# Patient Record
Sex: Male | Born: 1965
Health system: Southern US, Community
[De-identification: ages and names within clinical notes are randomized; demographics above are authoritative.]

## PROBLEM LIST (undated history)

## (undated) DIAGNOSIS — I10 Essential (primary) hypertension: Secondary | ICD-10-CM

## (undated) DIAGNOSIS — M549 Dorsalgia, unspecified: Secondary | ICD-10-CM

## (undated) DIAGNOSIS — F319 Bipolar disorder, unspecified: Secondary | ICD-10-CM

## (undated) DIAGNOSIS — F4310 Post-traumatic stress disorder, unspecified: Secondary | ICD-10-CM

## (undated) HISTORY — PX: HAND SURGERY: SHX662

## (undated) HISTORY — PX: MOUTH SURGERY: SHX715

---

## 2011-08-08 ENCOUNTER — Encounter (HOSPITAL_COMMUNITY): Payer: Self-pay | Admitting: Emergency Medicine

## 2011-08-08 ENCOUNTER — Emergency Department (HOSPITAL_COMMUNITY)
Admission: EM | Admit: 2011-08-08 | Discharge: 2011-08-08 | Disposition: A | Discharge status: Home / Self Care | Payer: Self-pay | Attending: Emergency Medicine | Admitting: Emergency Medicine

## 2011-08-08 DIAGNOSIS — R109 Unspecified abdominal pain: Secondary | ICD-10-CM | POA: Insufficient documentation

## 2011-08-08 DIAGNOSIS — R197 Diarrhea, unspecified: Secondary | ICD-10-CM | POA: Insufficient documentation

## 2011-08-08 HISTORY — DX: Essential (primary) hypertension: I10

## 2011-08-08 LAB — HEPATIC FUNCTION PANEL
Bilirubin, Direct: 0.1 mg/dL (ref 0.0–0.3)
Total Bilirubin: 0.7 mg/dL (ref 0.3–1.2)

## 2011-08-08 LAB — POCT I-STAT, CHEM 8
Creatinine, Ser: 1.1 mg/dL (ref 0.50–1.35)
Glucose, Bld: 106 mg/dL — ABNORMAL HIGH (ref 70–99)
Hemoglobin: 19 g/dL — ABNORMAL HIGH (ref 13.0–17.0)
TCO2: 20 mmol/L (ref 0–100)

## 2011-08-08 LAB — DIFFERENTIAL
Eosinophils Absolute: 0.3 10*3/uL (ref 0.0–0.7)
Eosinophils Relative: 3 % (ref 0–5)
Lymphs Abs: 1.9 10*3/uL (ref 0.7–4.0)
Monocytes Relative: 8 % (ref 3–12)

## 2011-08-08 LAB — CBC
Hemoglobin: 17.9 g/dL — ABNORMAL HIGH (ref 13.0–17.0)
MCH: 30.3 pg (ref 26.0–34.0)
MCV: 86.9 fL (ref 78.0–100.0)
RBC: 5.9 MIL/uL — ABNORMAL HIGH (ref 4.22–5.81)

## 2011-08-08 LAB — LIPASE, BLOOD: Lipase: 32 U/L (ref 11–59)

## 2011-08-08 MED ORDER — DICYCLOMINE HCL 10 MG/ML IM SOLN
20.0000 mg | Freq: Once | INTRAMUSCULAR | Status: AC
  Administered 2011-08-08: 20 mg via INTRAMUSCULAR
  Filled 2011-08-08: qty 2

## 2011-08-08 MED ORDER — PROMETHAZINE HCL 25 MG PO TABS: 25.0000 mg | ORAL_TABLET | Freq: Four times a day (QID) | ORAL | Status: DC | PRN

## 2011-08-08 MED ORDER — ONDANSETRON HCL 4 MG/2ML IJ SOLN
4.0000 mg | Freq: Once | INTRAMUSCULAR | Status: AC
  Administered 2011-08-08: 4 mg via INTRAVENOUS
  Filled 2011-08-08: qty 2

## 2011-08-08 NOTE — ED Notes (Signed)
Generalized abd pain x 5 days with diarrhea. Reports nausea, no vomiting.

## 2011-08-08 NOTE — Discharge Instructions (Signed)
Diarrhea Infections caused by germs (bacterial) or a virus commonly cause diarrhea. Your caregiver has determined that with time, rest and fluids, the diarrhea should improve. In general, eat normally while drinking more water than usual. Although water may prevent dehydration, it does not contain salt and minerals (electrolytes). Broths, weak tea without caffeine and oral rehydration solutions (ORS) replace fluids and electrolytes. Small amounts of fluids should be taken frequently. Large amounts at one time may not be tolerated. Plain water may be harmful in infants and the elderly. Oral rehydrating solutions (ORS) are available at pharmacies and grocery stores. ORS replace water and important electrolytes in proper proportions. Sports drinks are not as effective as ORS and may be harmful due to sugars worsening diarrhea.  ORS is especially recommended for use in children with diarrhea. As a general guideline for children, replace any new fluid losses from diarrhea and/or vomiting with ORS as follows:   If your child weighs 22 pounds or under (10 kg or less), give 60-120 mL ( -  cup or 2 - 4 ounces) of ORS for each episode of diarrheal stool or vomiting episode.   If your child weighs more than 22 pounds (more than 10 kgs), give 120-240 mL ( - 1 cup or 4 - 8 ounces) of ORS for each diarrheal stool or episode of vomiting.   While correcting for dehydration, children should eat normally. However, foods high in sugar should be avoided because this may worsen diarrhea. Large amounts of carbonated soft drinks, juice, gelatin desserts and other highly sugared drinks should be avoided.   After correction of dehydration, other liquids that are appealing to the child may be added. Children should drink small amounts of fluids frequently and fluids should be increased as tolerated. Children should drink enough fluids to keep urine clear or pale yellow.   Adults should eat normally while drinking more fluids  than usual. Drink small amounts of fluids frequently and increase as tolerated. Drink enough fluids to keep urine clear or pale yellow. Broths, weak decaffeinated tea, lemon lime soft drinks (allowed to go flat) and ORS replace fluids and electrolytes.   Avoid:   Carbonated drinks.   Juice.   Extremely hot or cold fluids.   Caffeine drinks.   Fatty, greasy foods.   Alcohol.   Tobacco.   Too much intake of anything at one time.   Gelatin desserts.   Probiotics are active cultures of beneficial bacteria. They may lessen the amount and number of diarrheal stools in adults. Probiotics can be found in yogurt with active cultures and in supplements.   Wash hands well to avoid spreading bacteria and virus.   Anti-diarrheal medications are not recommended for infants and children.   Only take over-the-counter or prescription medicines for pain, discomfort or fever as directed by your caregiver. Do not give aspirin to children because it may cause Reye's Syndrome.   For adults, ask your caregiver if you should continue all prescribed and over-the-counter medicines.   If your caregiver has given you a follow-up appointment, it is very important to keep that appointment. Not keeping the appointment could result in a chronic or permanent injury, and disability. If there is any problem keeping the appointment, you must call back to this facility for assistance.  SEEK IMMEDIATE MEDICAL CARE IF:   You or your child is unable to keep fluids down or other symptoms or problems become worse in spite of treatment.   Vomiting or diarrhea develops and becomes persistent.     There is vomiting of blood or bile (green material).   There is blood in the stool or the stools are black and tarry.   There is no urine output in 6-8 hours or there is only a small amount of very dark urine.   Abdominal pain develops, increases or localizes.   You have a fever.   Your baby is older than 3 months with a  rectal temperature of 102 F (38.9 C) or higher.   Your baby is 3 months old or younger with a rectal temperature of 100.4 F (38 C) or higher.   You or your child develops excessive weakness, dizziness, fainting or extreme thirst.   You or your child develops a rash, stiff neck, severe headache or become irritable or sleepy and difficult to awaken.  MAKE SURE YOU:   Understand these instructions.   Will watch your condition.   Will get help right away if you are not doing well or get worse.  Document Released: 02/10/2002 Document Revised: 02/09/2011 Document Reviewed: 12/28/2008 Exit[employer] Patient Information 2012 [employer]. 

## 2011-08-08 NOTE — ED Provider Notes (Signed)
History     CSN: [id number]  Arrival date & time 08/08/11  0934   None     Chief Complaint  Patient presents with  . Abdominal Pain    (Consider location/radiation/quality/duration/timing/severity/associated sxs/prior treatment) Patient is a 46 y.o. male presenting with diarrhea. The history is provided by the patient. No language interpreter was used.  Diarrhea The primary symptoms include diarrhea. The illness began yesterday. The onset was sudden. The problem has been gradually worsening.  The illness is also significant for chills. Associated medical issues do not include inflammatory bowel disease, gastric bypass, irritable bowel syndrome or diverticulitis. Risk factors: chicken fried in bad grease.  Pt reports he has had multiple episodes of vomitting.    Past Medical History  Diagnosis Date  . Hypertension     Past Surgical History  Procedure Date  . Hand surgery     No family history on file.  History  Substance Use Topics  . Smoking status: Current Everyday Smoker  . Smokeless tobacco: Not on file  . Alcohol Use: No      Review of Systems  Constitutional: Positive for chills.  Respiratory: Positive for cough and shortness of breath.   Gastrointestinal: Positive for diarrhea.  All other systems reviewed and are negative.    Allergies  Review of patient's allergies indicates no known allergies.  Home Medications   Current Outpatient Rx  Name Route Sig Dispense Refill  . LISINOPRIL 20 MG PO TABS Oral Take 20 mg by mouth daily.      BP 148/99  Pulse 94  Temp(Src) 98.5 F (36.9 C) (Oral)  Resp 16  SpO2 96%  Physical Exam  Constitutional: He is oriented to person, place, and time.  HENT:  Head: Normocephalic and atraumatic.  Eyes: Conjunctivae are normal. Pupils are equal, round, and reactive to light.  Neck: Normal range of motion. Neck supple.  Cardiovascular: Normal rate and normal heart sounds.   Pulmonary/Chest: Effort normal and breath  sounds normal.  Abdominal: Soft.  Musculoskeletal: Normal range of motion.  Neurological: He is alert and oriented to person, place, and time. He has normal reflexes.  Skin: Skin is warm.  Psychiatric: He has a normal mood and affect.    ED Course  Procedures (including critical care time)  Labs Reviewed  CBC - Abnormal; Notable for the following:    RBC 5.90 (*)    Hemoglobin 17.9 (*)    All other components within normal limits  POCT I-STAT, CHEM 8 - Abnormal; Notable for the following:    Glucose, Bld 106 (*)    Calcium, Ion 1.34 (*)    Hemoglobin 19.0 (*)    HCT 56.0 (*)    All other components within normal limits  DIFFERENTIAL   No results found.   No diagnosis found.    MDM   Results for orders placed during the hospital encounter of 08/08/11  CBC      Component Value Range   WBC 9.6  4.0 - 10.5 (K/uL)   RBC 5.90 (*) 4.22 - 5.81 (MIL/uL)   Hemoglobin 17.9 (*) 13.0 - 17.0 (g/dL)   HCT 40.9  81.1 - 91.4 (%)   MCV 86.9  78.0 - 100.0 (fL)   MCH 30.3  26.0 - 34.0 (pg)   MCHC 34.9  30.0 - 36.0 (g/dL)   RDW 78.2  95.6 - 21.3 (%)   Platelets 263  150 - 400 (K/uL)  DIFFERENTIAL      Component Value Range  Neutrophils Relative 68  43 - 77 (%)   Neutro Abs 6.5  1.7 - 7.7 (K/uL)   Lymphocytes Relative 20  12 - 46 (%)   Lymphs Abs 1.9  0.7 - 4.0 (K/uL)   Monocytes Relative 8  3 - 12 (%)   Monocytes Absolute 0.8  0.1 - 1.0 (K/uL)   Eosinophils Relative 3  0 - 5 (%)   Eosinophils Absolute 0.3  0.0 - 0.7 (K/uL)   Basophils Relative 0  0 - 1 (%)   Basophils Absolute 0.0  0.0 - 0.1 (K/uL)  POCT I-STAT, CHEM 8      Component Value Range   Sodium 140  135 - 145 (mEq/L)   Potassium 4.3  3.5 - 5.1 (mEq/L)   Chloride 111  96 - 112 (mEq/L)   BUN 21  6 - 23 (mg/dL)   Creatinine, Ser 1.91  0.50 - 1.35 (mg/dL)   Glucose, Bld 478 (*) 70 - 99 (mg/dL)   Calcium, Ion 2.95 (*) 1.12 - 1.32 (mmol/L)   TCO2 20  0 - 100 (mmol/L)   Hemoglobin 19.0 (*) 13.0 - 17.0 (g/dL)   HCT  62.1 (*) 30.8 - 52.0 (%)  HEPATIC FUNCTION PANEL      Component Value Range   Total Protein 8.4 (*) 6.0 - 8.3 (g/dL)   Albumin 4.4  3.5 - 5.2 (g/dL)   AST 36  0 - 37 (U/L)   ALT 64 (*) 0 - 53 (U/L)   Alkaline Phosphatase 85  39 - 117 (U/L)   Total Bilirubin 0.7  0.3 - 1.2 (mg/dL)   Bilirubin, Direct 0.1  0.0 - 0.3 (mg/dL)   Indirect Bilirubin 0.6  0.3 - 0.9 (mg/dL)  LIPASE, BLOOD      Component Value Range   Lipase 32  11 - 59 (U/L)   No results found.    Pt given Iv fluids,  zofran and bentyl..  I advised imodium.  Pt given phenergan for nausea.   Pt advised to return for recheck if not significantly improving in 24 hours    Lonia Skinner [place], [place] 08/08/11 1240

## 2011-08-08 NOTE — ED Notes (Signed)
Pt to be d/c'ed after second liter of fluid finishes.

## 2011-08-08 NOTE — ED Provider Notes (Signed)
Medical screening examination/treatment/procedure(s) were performed by non-physician practitioner and as supervising physician I was immediately available for consultation/collaboration.  Cheri Guppy, MD 08/08/11 1538

## 2011-08-08 NOTE — ED Notes (Signed)
Onset 5 days ago general abdominal pain fullness with nausea and loose stool. Pain currently 6-7/10 achy sharp.

## 2011-11-26 ENCOUNTER — Emergency Department (HOSPITAL_COMMUNITY)
Admission: EM | Admit: 2011-11-26 | Discharge: 2011-11-26 | Disposition: A | Discharge status: Home / Self Care | Payer: Self-pay | Attending: Emergency Medicine | Admitting: Emergency Medicine

## 2011-11-26 ENCOUNTER — Encounter (HOSPITAL_COMMUNITY): Payer: Self-pay | Admitting: *Deleted

## 2011-11-26 DIAGNOSIS — X58XXXA Exposure to other specified factors, initial encounter: Secondary | ICD-10-CM | POA: Insufficient documentation

## 2011-11-26 DIAGNOSIS — K029 Dental caries, unspecified: Secondary | ICD-10-CM | POA: Insufficient documentation

## 2011-11-26 DIAGNOSIS — F17200 Nicotine dependence, unspecified, uncomplicated: Secondary | ICD-10-CM | POA: Insufficient documentation

## 2011-11-26 DIAGNOSIS — K0889 Other specified disorders of teeth and supporting structures: Secondary | ICD-10-CM

## 2011-11-26 DIAGNOSIS — S025XXA Fracture of tooth (traumatic), initial encounter for closed fracture: Secondary | ICD-10-CM | POA: Insufficient documentation

## 2011-11-26 DIAGNOSIS — I10 Essential (primary) hypertension: Secondary | ICD-10-CM | POA: Insufficient documentation

## 2011-11-26 MED ORDER — HYDROCODONE-ACETAMINOPHEN 7.5-500 MG/15ML PO SOLN: 15.0000 mL | Freq: Four times a day (QID) | ORAL | Status: DC | PRN

## 2011-11-26 MED ORDER — PENICILLIN V POTASSIUM 500 MG PO TABS: 500.0000 mg | ORAL_TABLET | Freq: Three times a day (TID) | ORAL | Status: DC

## 2011-11-26 NOTE — ED Provider Notes (Signed)
History     CSN: [id number]  Arrival date & time 11/26/11  6644   First MD Initiated Contact with Patient 11/26/11 1102      Chief Complaint  Patient presents with  . Dental Pain    (Consider location/radiation/quality/duration/timing/severity/associated sxs/prior treatment) HPI  46 year old male presents complaining of dental pain. Patient reports he has a broken tooth to his right lower jaw which he broke several months ago. While eating bojango he accidentally chipped the same tooth again 2 weeks ago. For the past 3 days he has increasing pain to the tooth.  Described as sharp, stabbing and radiates to his R ear.  Pain worsening with eating and with cold water.  Denies fever, sore throat, neck pain, recent trauma, or rash.  Denies hearing changes.  Has tried ibuprofen without relief.  Is a smoker.    Past Medical History  Diagnosis Date  . Hypertension     Past Surgical History  Procedure Date  . Hand surgery     No family history on file.  History  Substance Use Topics  . Smoking status: Current Every Day Smoker  . Smokeless tobacco: Not on file  . Alcohol Use: No      Review of Systems  All other systems reviewed and are negative.    Allergies  Review of patient's allergies indicates no known allergies.  Home Medications   Current Outpatient Rx  Name Route Sig Dispense Refill  . AMLODIPINE BESYLATE 10 MG PO TABS Oral Take 10 mg by mouth daily.    Marland Kitchen LISINOPRIL 20 MG PO TABS Oral Take 40 mg by mouth daily.     Marland Kitchen PROMETHAZINE HCL 25 MG PO TABS Oral Take 1 tablet (25 mg total) by mouth every 6 (six) hours as needed for nausea. 15 tablet 0    BP 133/80  Pulse 97  Temp 98.2 F (36.8 C) (Oral)  Resp 16  SpO2 98%  Physical Exam  Nursing note and vitals reviewed. Constitutional: He is oriented to person, place, and time. He appears well-developed and well-nourished. No distress.  HENT:  Head: Normocephalic and atraumatic.  Right Ear: External ear  normal.  Left Ear: External ear normal.  Nose: Nose normal.  Mouth/Throat: Oropharynx is clear and moist. No oropharyngeal exudate.    Eyes: Conjunctivae normal are normal.  Neck: Neck supple.  Musculoskeletal: Normal range of motion.  Lymphadenopathy:    He has no cervical adenopathy.  Neurological: He is alert and oriented to person, place, and time.  Skin: Skin is warm. No rash noted.  Psychiatric: He has a normal mood and affect.    ED Course  Procedures (including critical care time)  Labs Reviewed - No data to display No results found.   No diagnosis found.  1. Dental pain  MDM  Pt with dental pain from an avulsed right lower premolar.  Has no fever, or obvious abscess.  Will prescribe abx and pain medication.  Dental referral given.    BP 133/80  Pulse 97  Temp 98.2 F (36.8 C) (Oral)  Resp 16  SpO2 98%         Fayrene Helper, PA-C 11/26/11 1131

## 2011-11-26 NOTE — ED Provider Notes (Signed)
Medical screening examination/treatment/procedure(s) were performed by non-physician practitioner and as supervising physician I was immediately available for consultation/collaboration.   Larry Griffith. Quadry Kampa, MD 11/26/11 1141

## 2014-02-13 ENCOUNTER — Encounter (HOSPITAL_COMMUNITY): Payer: Self-pay | Admitting: Emergency Medicine

## 2014-02-13 ENCOUNTER — Emergency Department (HOSPITAL_COMMUNITY)
Admission: EM | Admit: 2014-02-13 | Discharge: 2014-02-14 | Disposition: A | Discharge status: Home / Self Care | Payer: Self-pay | Attending: Emergency Medicine | Admitting: Emergency Medicine

## 2014-02-13 DIAGNOSIS — Z79899 Other long term (current) drug therapy: Secondary | ICD-10-CM | POA: Insufficient documentation

## 2014-02-13 DIAGNOSIS — R197 Diarrhea, unspecified: Secondary | ICD-10-CM | POA: Insufficient documentation

## 2014-02-13 DIAGNOSIS — N289 Disorder of kidney and ureter, unspecified: Secondary | ICD-10-CM | POA: Insufficient documentation

## 2014-02-13 DIAGNOSIS — R1110 Vomiting, unspecified: Secondary | ICD-10-CM | POA: Insufficient documentation

## 2014-02-13 DIAGNOSIS — Z792 Long term (current) use of antibiotics: Secondary | ICD-10-CM | POA: Insufficient documentation

## 2014-02-13 DIAGNOSIS — Z720 Tobacco use: Secondary | ICD-10-CM | POA: Insufficient documentation

## 2014-02-13 DIAGNOSIS — I10 Essential (primary) hypertension: Secondary | ICD-10-CM | POA: Insufficient documentation

## 2014-02-13 DIAGNOSIS — R55 Syncope and collapse: Secondary | ICD-10-CM | POA: Insufficient documentation

## 2014-02-13 LAB — BASIC METABOLIC PANEL
ANION GAP: 12 (ref 5–15)
BUN: 21 mg/dL (ref 6–23)
CHLORIDE: 100 meq/L (ref 96–112)
CO2: 26 meq/L (ref 19–32)
CREATININE: 1.56 mg/dL — AB (ref 0.50–1.35)
Calcium: 8.5 mg/dL (ref 8.4–10.5)
GFR calc Af Amer: 59 mL/min — ABNORMAL LOW (ref >90)
GFR calc non Af Amer: 51 mL/min — ABNORMAL LOW (ref >90)
GLUCOSE: 108 mg/dL — AB (ref 70–99)
Potassium: 3.7 mEq/L (ref 3.7–5.3)
Sodium: 138 mEq/L (ref 137–147)

## 2014-02-13 LAB — CBC
HCT: 43.1 % (ref 39.0–52.0)
HEMOGLOBIN: 14.9 g/dL (ref 13.0–17.0)
MCH: 29.6 pg (ref 26.0–34.0)
MCHC: 34.6 g/dL (ref 30.0–36.0)
MCV: 85.5 fL (ref 78.0–100.0)
PLATELETS: 282 10*3/uL (ref 150–400)
RBC: 5.04 MIL/uL (ref 4.22–5.81)
RDW: 12.8 % (ref 11.5–15.5)
WBC: 9.4 10*3/uL (ref 4.0–10.5)

## 2014-02-13 LAB — I-STAT CHEM 8, ED
BUN: 24 mg/dL — AB (ref 6–23)
CALCIUM ION: 1.07 mmol/L — AB (ref 1.12–1.23)
CHLORIDE: 100 meq/L (ref 96–112)
CREATININE: 1.7 mg/dL — AB (ref 0.50–1.35)
Glucose, Bld: 110 mg/dL — ABNORMAL HIGH (ref 70–99)
HCT: 47 % (ref 39.0–52.0)
Hemoglobin: 16 g/dL (ref 13.0–17.0)
Potassium: 3.5 mEq/L — ABNORMAL LOW (ref 3.7–5.3)
SODIUM: 139 meq/L (ref 137–147)
TCO2: 25 mmol/L (ref 0–100)

## 2014-02-13 LAB — CBG MONITORING, ED: Glucose-Capillary: 121 mg/dL — ABNORMAL HIGH (ref 70–99)

## 2014-02-13 LAB — TROPONIN I

## 2014-02-13 MED ORDER — SODIUM CHLORIDE 0.9 % IV BOLUS (SEPSIS)
1000.0000 mL | Freq: Once | INTRAVENOUS | Status: AC
  Administered 2014-02-14: 1000 mL via INTRAVENOUS

## 2014-02-13 MED ORDER — SODIUM CHLORIDE 0.9 % IV BOLUS (SEPSIS)
1000.0000 mL | Freq: Once | INTRAVENOUS | Status: AC
  Administered 2014-02-13: 1000 mL via INTRAVENOUS

## 2014-02-13 NOTE — ED Notes (Signed)
Pt was placed in a gown with continuous cardiac monitoring, BP cuff and pulse ox on

## 2014-02-13 NOTE — ED Notes (Signed)
Pt sts he donated plasma, drank two bottles of water and ate a peanut butter and jelly sandwich.  Shortly after eating patient became diaphoretic and vomited.  Pt also experienced uncontrollable diarrhea.  Two bystanders told pt "you passed out".  [facility] went [facility] to [facility], and afterwards had another episode of diaphoresis, emesis and syncope.  Pt denies any injury, falls, chest pain.

## 2014-02-13 NOTE — ED Provider Notes (Signed)
CSN: [id number]     Arrival date & time 02/13/14  2015 History   First MD Initiated Contact with Patient 02/13/14 2040     Chief Complaint  Patient presents with  . Loss of Consciousness     (Consider location/radiation/quality/duration/timing/severity/associated sxs/prior Treatment) HPI Comments: Patient presents with syncope. [facility] is [facility] at [facility]. He states that he donated plasma today and he hasn't really eaten anything most of the day. He hasn't been drinking well. He was sitting out on the curve trying to eat a peanut butter sandwich and had a syncopal episode. He states that he had an episode of vomiting and diarrhea with the syncope. He then went up and took a shower and felt better. When he came downstairs he got lightheaded again and diaphoretic and had a second syncopal episode. Currently feels much better. He was noted to be hypotensive on EMS arrival. He denies any chest pain. He feels a little bit short of breath. He denies any recent illnesses. He denies any headache or any injuries from the syncope. He denies any fevers cough congestion or other recent illnesses. He denies any past history of syncope or any past heart problems. He does have a history of hypertension and did take his antihypertensive medications today. He denies any alcohol or drug use other than occasional marijuana use. He denies any IV drug use.  Patient is a 48 y.o. male presenting with syncope.  Loss of Consciousness Associated symptoms: vomiting (1)   Associated symptoms: no chest pain, no diaphoresis, no dizziness, no fever, no headaches, no nausea, no shortness of breath and no weakness     Past Medical History  Diagnosis Date  . Hypertension    Past Surgical History  Procedure Laterality Date  . Hand surgery     No family history on file. History  Substance Use Topics  . Smoking status: Current Every Day Smoker -- 0.50 packs/day  . Smokeless tobacco: Not on file  .  Alcohol Use: No    Review of Systems  Constitutional: Negative for fever, chills, diaphoresis and fatigue.  HENT: Negative for congestion, rhinorrhea and sneezing.   Eyes: Negative.   Respiratory: Negative for cough, chest tightness and shortness of breath.   Cardiovascular: Positive for syncope. Negative for chest pain and leg swelling.  Gastrointestinal: Positive for vomiting (1) and diarrhea (1). Negative for nausea, abdominal pain and blood in stool.  Genitourinary: Negative for frequency, hematuria, flank pain and difficulty urinating.  Musculoskeletal: Negative for back pain and arthralgias.  Skin: Negative for rash.  Neurological: Positive for syncope. Negative for dizziness, speech difficulty, weakness, numbness and headaches.      Allergies  Review of patient's allergies indicates no known allergies.  Home Medications   Prior to Admission medications   Medication Sig Start Date End Date Taking? Authorizing Provider  amLODipine (NORVASC) 10 MG tablet Take 10 mg by mouth daily.   Yes Historical Provider, MD  carvedilol (COREG) 12.5 MG tablet Take 12.5 mg by mouth 2 (two) times daily with a meal.   Yes Historical Provider, MD  ibuprofen (ADVIL,MOTRIN) 800 MG tablet Take 800 mg by mouth every 8 (eight) hours as needed for moderate pain.   Yes Historical Provider, MD  lisinopril-hydrochlorothiazide (PRINZIDE,ZESTORETIC) 20-25 MG per tablet Take 1 tablet by mouth daily.   Yes Historical Provider, MD  penicillin v potassium (VEETID) 500 MG tablet Take 1 tablet (500 mg total) by mouth 3 (three) times daily. 11/26/11   Fayrene HelperBowie Tran, PA-C  promethazine (PHENERGAN) 25 MG tablet Take 1 tablet (25 mg total) by mouth every 6 (six) hours as needed for nausea. 08/08/11 08/15/11  Lonia SkinnerLeslie K Sofia, PA-C   BP 135/78 mmHg  Pulse 71  Temp(Src) 97.6 F (36.4 C) (Oral)  Resp 19  SpO2 93% Physical Exam  Constitutional: He is oriented to person, place, and time. He appears well-developed and  well-nourished.  HENT:  Head: Normocephalic and atraumatic.  Eyes: Pupils are equal, round, and reactive to light.  Neck: Normal range of motion. Neck supple.  Cardiovascular: Normal rate, regular rhythm and normal heart sounds.   Pulmonary/Chest: Effort normal and breath sounds normal. No respiratory distress. He has no wheezes. He has no rales. He exhibits no tenderness.  Abdominal: Soft. Bowel sounds are normal. There is no tenderness. There is no rebound and no guarding.  Musculoskeletal: Normal range of motion. He exhibits no edema.  Lymphadenopathy:    He has no cervical adenopathy.  Neurological: He is alert and oriented to person, place, and time. He has normal strength. No cranial nerve deficit or sensory deficit. GCS eye subscore is 4. GCS verbal subscore is 5. GCS motor subscore is 6.  Finger-nose intact, no pronator drift  Skin: Skin is warm and dry. No rash noted.  Psychiatric: He has a normal mood and affect.    ED Course  Procedures (including critical care time) Labs Review Labs Reviewed  BASIC METABOLIC PANEL - Abnormal; Notable for the following:    Glucose, Bld 108 (*)    Creatinine, Ser 1.56 (*)    GFR calc non Af Amer 51 (*)    GFR calc Af Amer 59 (*)    All other components within normal limits  CBG MONITORING, ED - Abnormal; Notable for the following:    Glucose-Capillary 121 (*)    All other components within normal limits  I-STAT CHEM 8, ED - Abnormal; Notable for the following:    Potassium 3.5 (*)    BUN 24 (*)    Creatinine, Ser 1.70 (*)    Glucose, Bld 110 (*)    Calcium, Ion 1.07 (*)    All other components within normal limits  CBC  TROPONIN I    Imaging Review No results found.   EKG Interpretation   Date/Time:  Friday February 13 2014 20:29:49 EST Ventricular Rate:  62 PR Interval:  165 QRS Duration: 85 QT Interval:  424 QTC Calculation: 431 R Axis:   61 Text Interpretation:  Sinus rhythm ST elevation suggests acute  pericarditis  No old tracing to compare probable early repolarization  Confirmed by Tresea Heine  MD, Keevin Panebianco ([id number]) on 02/13/2014 9:12:19 PM      MDM   Final diagnoses:  None    Pt has had 2L of fluid, still feels dizzy on ambulating.  Dr. Ranae PalmsYelverton to follow.  Will give one more liter of fluid.  Pt will need f/u creatinine by his PCP.  I did discuss this with pt.  [facility].  He does not have symptoms that sound consistent with ACS.    Rolan BuccoMelanie Joclyn Alsobrook, MD 02/14/14 [phone]

## 2014-02-13 NOTE — ED Notes (Signed)
[facility] after a syncopal [facility] today, states he donated plasma and hasn't had much to eat or drink today. Hyperacute T waves on EKGs. Pt a/ox4 at this time, unknown if pt hit his head.

## 2014-02-13 NOTE — ED Notes (Signed)
Patient ambulated to and from restroom without assistance. States that he feels dizzy when standing and ambulating.

## 2014-02-14 NOTE — ED Provider Notes (Signed)
Patient ambulating without difficulty. Awake and alert. Oxygen saturation is 93% on room air.  Loren Raceravid Lindee Leason, MD 02/14/14 [phone]

## 2014-02-14 NOTE — Discharge Instructions (Signed)
°Emergency Department Resource Guide °1) Find a Doctor and Pay Out of Pocket °Although you won't have to find out who is covered by your insurance plan, it is a good idea to ask around and get recommendations. You will then need to call the office and see if the doctor you have chosen will accept you as a new patient and what types of options they offer for patients who are self-pay. Some doctors offer discounts or will set up payment plans for their patients who do not have insurance, but you will need to ask so you aren't surprised when you get to your appointment. ° °2) Contact Your Local Health Department °Not all health departments have doctors that can see patients for sick visits, but many do, so it is worth a call to see if yours does. If you don't know where your local health department is, you can check in your phone book. The CDC also has a tool to help you locate your state's health department, and many state websites also have listings of all of their local health departments. ° °3) Find a Walk-in Clinic °If your illness is not likely to be very severe or complicated, you may want to try a walk in clinic. These are popping up all over the country in pharmacies, drugstores, and shopping centers. They're usually staffed by nurse practitioners or physician assistants that have been trained to treat common illnesses and complaints. They're usually fairly quick and inexpensive. However, if you have serious medical issues or chronic medical problems, these are probably not your best option. ° °No Primary Care Doctor: °- Call [facility] at  832-8000 - they can help you locate a primary care doctor that  accepts your insurance, provides certain services, etc. °- Physician Referral Service- 1-800-533-3463 ° °Chronic Pain Problems: °Organization         Address  Phone   Notes  °[facility]  (336) 297-2271 Patients need to be referred by their primary care doctor.  ° °Medication  Assistance: °Organization         Address  Phone   Notes  °[place] [facility] [address] °[place], [place] [address] (336) 641-8030 --Must be a resident of [place] °-- Must have NO insurance coverage whatsoever (no Medicaid/ Medicare, etc.) °-- The pt. MUST have a primary care doctor that directs their care regularly and follows them in the community °  °[facility]  (866) 331-1348   °[employer]  (888) 892-1162   ° °Agencies that provide inexpensive medical care: °Organization         Address  Phone   Notes  °[facility]  (336) 832-8035   °[facility]    (336) 832-7272   °[facility] [address] °[place], [place] [address] (336) 832-4777   °[facility] [address], °[place] (336) 271-4999   °[facility]    (336) 373-0678   °[facility]    (336) 272-1050   °[facility] ° [address], [place] Phone:  (336) 832-4444, Fax:  (336) 832-4440 Hours of Operation:  9 am - 6 pm, M-F.  Also accepts Medicaid/Medicare and self-pay.  °[facility] ° [address], [place] Phone: (336) 832-3150, Fax: (336) 832-3151. Hours of Operation:  8:30 am - 5:30 pm, M-F.  Also accepts Medicaid and self-pay.  °[facility] [place] [address], [place] Phone: (336) 878-6027   °[facility] [address], [place], [place] (336)723-1848, Ext. 123 Mondays & Thursdays: 7-9 AM.  First 15 patients are seen on a first come, first serve basis. °  ° °Medicaid-accepting [place] Providers: ° °Organization         Address  Phone   Notes  °[facility] [address], [place] (336) 641-2100 Also accepts self-pay patients.  °[facility] [address], [place] ° (336) 856-9996   °[facility] [address], [place]  (336) 288-8857   °[facility] [address] [place] [address], [place] (336) 299-7000   °Veita Bland [address], [place]  ° (336) 373-1557 Only accepts [place] Access Medicaid patients after they have their name applied to their card.  ° °Self-Pay (no insurance) in [place]: ° °Organization         Address  Phone   Notes  °Sickle Cell Patients, [facility] [address], [place] (336) 832-1970   °[facility] [address], [place] (336) 832-4400   °[facility] [place] ° [address], [place] (336) 992-4800   °[facility]/Dr. Osei-Bonsu ° [address] [place] [address], [place] or [address], [place] (336) 841-8500 Phone number for both [place] and [place] locations is the same.  °[facility] [address], [place] (336) 299-0000   °[facility] [address] [place] [address], [place] or [address] (336) 852-7530 °(336) 878-2260   °[facility] [address], [place] (336) 350-1642, phone; (336) 294-5005, fax Sees patients 1st and 3rd Saturday of every month.  Must not qualify for public or private insurance (i.e. Medicaid, Medicare, [place] Health Choice, Veterans' Benefits) • Household income should be no more than 200% of the poverty level •The clinic cannot treat you if you are pregnant or think you are pregnant • Sexually transmitted diseases are not treated at the clinic.  ° ° °Dental Care: °Organization         Address  Phone  Notes  °[place] [facility] [address], [place] (336) 641-6152 Accepts children up to age 21 who are enrolled in Medicaid or [place] Health Choice; pregnant women with a Medicaid card; and children who have applied for Medicaid or [place] Health Choice, but were declined, whose parents can pay a reduced fee at time of service.  °[place]  [facility] [place]  [address], [place] (336) 641-7733 Accepts children up to age 21 who are enrolled in Medicaid or [place] Health Choice; pregnant women with a Medicaid card; and children who have applied for Medicaid or [place] Health Choice, but were declined, whose parents can pay a reduced fee at time of service.  °[facility] ° [address], [place] (336) 641-4533 Patients are seen by appointment only. Walk-ins are not accepted. [facility] will see patients 18 years of age and older. °Monday - Tuesday (8am-5pm) °Most Wednesdays (8:30-5pm) °$30 per visit, cash only  °[facility] ° [address], [place] (336) 641-4533 Patients are seen by appointment only. Walk-ins are not accepted. [facility] will see patients 18 years of age and older. °One   Wednesday Evening (Monthly: Volunteer Based).  $30 per visit, cash only  °[facility]  (919) 537-3737 for adults; Children under age 4, call Graduate Pediatric Dentistry at ([phone]) [phone]. Children aged 4-14, please call (919) [phone] to request a pediatric application. ° Dental services are provided in all areas of dental care including fillings, crowns and bridges, complete and partial dentures, implants, gum treatment, root canals, and extractions. Preventive care is also provided. Treatment is provided to both adults and children. °Patients are selected via a lottery and there is often a waiting list. °  °[facility] [address], °[place] ° (336) 763-8833 [email or web address] °  °[facility] [address], [place], [place] (336)723-1848, Ext. 123 Second and Fourth Thursday of each month, opens at 6:30 AM; Clinic ends at 9 AM.  Patients are seen on a first-come first-served basis, and a limited number are seen during each clinic.  ° °[facility] ° [address], [place], [place] (336) 723-7904    Eligibility Requirements °You must have lived in [place], [place], or [place] counties for at least the last three months. °  You cannot be eligible for state or federal sponsored healthcare insurance, including Veterans Administration, Medicaid, or Medicare. °  You generally cannot be eligible for healthcare insurance through your employer.  °  How to apply: °Eligibility screenings are held every Tuesday and Wednesday afternoon from 1:00 pm until 4:00 pm. You do not need an appointment for the interview!  °[facility] [address], [place], [place] 336-631-2330   °[facility]  336-342-8273   °[facility]  336-703-3100   °[facility]  336-570-6415   ° °Behavioral Health Resources in the Community: °Intensive Outpatient Programs °Organization         Address  Phone  Notes  °[facility] [address], [place], [place] 336-878-6098   °[facility] [address], [place], [place] 336-832-9800   °[facility] [address], [place], [place] ° 336-882-2125   °[facility] [address],  °[place], [place] 1-800-853-5163 or 336-641-4981   °Substance Abuse Resources °Organization         Address  Phone  Notes  °[facility]  336-882-2125   °[facility]  336-784-9470   °[facility]  336-285-9073   °[facility]  336-845-3988   °Residential & Outpatient Substance Abuse Program  1-800-659-3381   °Psychological Services °Organization         Address  Phone  Notes  °[facility]  336- 832-9600   °[facility]  336- 378-7881   °[facility] [address], [place] 1-800-853-5163 or 336-641-4981   ° °Mobile Crisis Teams °Organization         Address  Phone  Notes  °[facility], Mobile Crisis Care Unit  1-877-626-1772   °Assertive °[facility] ° [address]  [place], [place] 336-834-9664   °Sharon DeEsch [address] °[place] 336-554-5454   ° °Self-Help/Support Groups °Organization         Address  Phone             Notes  °[facility] - variety of support groups  336- 373-1402 Call for more information  °Narcotics Anonymous (NA), [facility] [address], °[place]  2 meetings at this location  ° °  Residential Treatment Programs °Organization         Address  Phone  Notes  °[facility] [address],    °[place]  1-866-801-8205   °[facility] ° [address], [place], [place] 704-293-8524   °[facility] [address], [place] 336-845-3988 Admissions: 8am-3pm M-F  °[facility] [address]    °[place], [place] 336-841-1104   °[facility] [address], [place], [place] 336-379-7146   °[facility] [address]  °[place], [place] 336-285-9073   °[facility] Intensive Outpatient [address], [place], [place] 336-852-3033   °[facility] [address]  °[place], [place] 1-877-615-2722 or 336-784-9470   °[facility] [address] [place], [place] 336-227-7417 Accepts Medicaid  °[facility] [address]  °[place] 1-800-659-3381 Substance Abuse/Addiction Treatment  ° °[place] Behavioral Health Resources °Organization         Address  Phone  Notes  °[facility]  (888) 581-9988   °Julie Brannon, PhD [address] [place], [place]   (336) 349-5553 or (336) 951-0000   °[facility]   [address] °[place], [place] (336) 349-4454   °[facility] [address], [place], [place] (336) 342-8316 Insurance/Medicaid/sponsorship through [facility]  °[facility] [address]                                    [place], [place] (336) 342-8316 Therapy/tele-psych/case    °[facility] [address]  ° [place], [place] (336) 349-2233    °Dr. Arfeen  (336) 349-4544   °[facility] [place]  [employer] [place] [facility] 1) [address], [place] °2) [address], [place] °3)  [address], [place] (336) 349-3220 °(336) 342-7768 ° °(336) 342-8140   °[place] [facility] (336) 342-1394 or (336) 342-3537 (After Hours)    ° ° ° °Syncope °Syncope is a medical term for fainting or passing out. This means you lose consciousness and drop to the ground. People are generally unconscious for less than 5 minutes. You may have some muscle twitches for up to 15 seconds before waking up and returning to normal. Syncope occurs more often in older adults, but it can happen to anyone. While most causes of syncope are not dangerous, syncope can be a sign of a serious medical problem. It is important to seek medical care.  °CAUSES  °Syncope is caused by a sudden drop in blood flow to the brain. The specific cause is often not determined. Factors that can bring on syncope include: °· Taking medicines that lower blood pressure. °· Sudden changes in posture, such as standing up quickly. °· Taking more medicine than prescribed. °· Standing in one place for too long. °· Seizure disorders. °· Dehydration and excessive exposure to heat. °· Low blood sugar (hypoglycemia). °· Straining to have a bowel movement. °· Heart disease, irregular heartbeat, or other circulatory problems. °· Fear, emotional distress, seeing blood, or severe pain. °SYMPTOMS  °Right before fainting, you may: °· Feel dizzy or light-headed. °· Feel nauseous. °· See all white or all black in your field of vision. °· Have cold, clammy skin. °DIAGNOSIS  °Your health care provider will ask about your symptoms, perform   a physical exam, and perform an electrocardiogram (ECG) to record the electrical activity of your heart. Your health care provider may also perform other heart or blood tests to  determine the cause of your syncope which may include: °· Transthoracic echocardiogram (TTE). During echocardiography, sound waves are used to evaluate how blood flows through your heart. °· Transesophageal echocardiogram (TEE). °· Cardiac monitoring. This allows your health care provider to monitor your heart rate and rhythm in real time. °· Holter monitor. This is a portable device that records your heartbeat and can help diagnose heart arrhythmias. It allows your health care provider to track your heart activity for several days, if needed. °· Stress tests by exercise or by giving medicine that makes the heart beat faster. °TREATMENT  °In most cases, no treatment is needed. Depending on the cause of your syncope, your health care provider may recommend changing or stopping some of your medicines. °HOME CARE INSTRUCTIONS °· Have someone stay with you until you feel stable. °· Do not drive, use machinery, or play sports until your health care provider says it is okay. °· Keep all follow-up appointments as directed by your health care provider. °· Lie down right away if you start feeling like you might faint. Breathe deeply and steadily. Wait until all the symptoms have passed. °· Drink enough fluids to keep your urine clear or pale yellow. °· If you are taking blood pressure or heart medicine, get up slowly and take several minutes to sit and then stand. This can reduce dizziness. °SEEK IMMEDIATE MEDICAL CARE IF:  °· You have a severe headache. °· You have unusual pain in the chest, abdomen, or back. °· You are bleeding from your mouth or rectum, or you have black or tarry stool. °· You have an irregular or very fast heartbeat. °· You have pain with breathing. °· You have repeated fainting or seizure-like jerking during an episode. °· You faint when sitting or lying down. °· You have confusion. °· You have trouble walking. °· You have severe weakness. °· You have vision problems. °If you fainted, call your local  emergency services (911 in [place] Do not drive yourself to the hospital.  °MAKE SURE YOU: °· Understand these instructions. °· Will watch your condition. °· Will get help right away if you are not doing well or get worse. °Document Released: 02/20/2005 Document Revised: 02/25/2013 Document Reviewed: 04/21/2011 °ExitCare® Patient Information ©2015 [employer]. This information is not intended to replace advice given to you by your health care provider. Make sure you discuss any questions you have with your health care provider. ° °

## 2014-03-05 ENCOUNTER — Emergency Department (HOSPITAL_COMMUNITY)
Admission: EM | Admit: 2014-03-05 | Discharge: 2014-03-05 | Disposition: A | Discharge status: Home / Self Care | Payer: Self-pay | Attending: Emergency Medicine | Admitting: Emergency Medicine

## 2014-03-05 ENCOUNTER — Encounter (HOSPITAL_COMMUNITY): Payer: Self-pay | Admitting: *Deleted

## 2014-03-05 ENCOUNTER — Emergency Department (HOSPITAL_COMMUNITY): Payer: Self-pay

## 2014-03-05 DIAGNOSIS — R059 Cough, unspecified: Secondary | ICD-10-CM

## 2014-03-05 DIAGNOSIS — Z792 Long term (current) use of antibiotics: Secondary | ICD-10-CM | POA: Insufficient documentation

## 2014-03-05 DIAGNOSIS — Z720 Tobacco use: Secondary | ICD-10-CM | POA: Insufficient documentation

## 2014-03-05 DIAGNOSIS — I10 Essential (primary) hypertension: Secondary | ICD-10-CM | POA: Insufficient documentation

## 2014-03-05 DIAGNOSIS — J069 Acute upper respiratory infection, unspecified: Secondary | ICD-10-CM

## 2014-03-05 DIAGNOSIS — R091 Pleurisy: Secondary | ICD-10-CM

## 2014-03-05 DIAGNOSIS — R05 Cough: Secondary | ICD-10-CM

## 2014-03-05 DIAGNOSIS — Z79899 Other long term (current) drug therapy: Secondary | ICD-10-CM | POA: Insufficient documentation

## 2014-03-05 LAB — RAPID STREP SCREEN (MED CTR MEBANE ONLY): Streptococcus, Group A Screen (Direct): NEGATIVE

## 2014-03-05 MED ORDER — BENZONATATE 100 MG PO CAPS: 100.0000 mg | ORAL_CAPSULE | Freq: Three times a day (TID) | ORAL | Status: DC

## 2014-03-05 MED ORDER — OXYMETAZOLINE HCL 0.05 % NA SOLN: 1.0000 | Freq: Two times a day (BID) | NASAL | Status: DC

## 2014-03-05 MED ORDER — ALBUTEROL SULFATE HFA 108 (90 BASE) MCG/ACT IN AERS
2.0000 | INHALATION_SPRAY | RESPIRATORY_TRACT | Status: DC | PRN
  Administered 2014-03-05: 2 via RESPIRATORY_TRACT
  Filled 2014-03-05: qty 6.7

## 2014-03-05 MED ORDER — NAPROXEN 500 MG PO TABS: 500.0000 mg | ORAL_TABLET | Freq: Two times a day (BID) | ORAL | Status: DC

## 2014-03-05 MED ORDER — KETOROLAC TROMETHAMINE 60 MG/2ML IM SOLN
60.0000 mg | Freq: Once | INTRAMUSCULAR | Status: AC
  Administered 2014-03-05: 60 mg via INTRAMUSCULAR
  Filled 2014-03-05: qty 2

## 2014-03-05 NOTE — ED Notes (Signed)
x1 week of a head cold; now having a productive cough (green), pleurisy from coughing, and sore throat; taking therma flu, contact mult-system, and Nyquil 3 days ago,

## 2014-03-05 NOTE — ED Notes (Signed)
Patient transported to X-ray 

## 2014-03-05 NOTE — Discharge Instructions (Signed)
Please read and follow all provided instructions.  Your diagnoses today include:  1. Upper respiratory tract infection   2. Pleurisy   3. Cough     You appear to have an upper respiratory infection (URI). An upper respiratory tract infection, or cold, is a viral infection of the air passages leading to the lungs. It should improve gradually after 7-10 days. You may have a lingering cough that lasts for 2- 4 weeks after the infection.  Tests performed today include:  Vital signs. See below for your results today.   Strep test - negative  Chest x-ray - no pneumonia  Medications prescribed:   Tessalon Perles - cough suppressant medication   Albuterol inhaler - medication that opens up your airway  Use inhaler as follows: 1-2 puffs with spacer every 4 hours as needed for wheezing, cough, or shortness of breath.    Naproxen - anti-inflammatory pain medication  Do not exceed 500mg  naproxen every 12 hours, take with food  You have been prescribed an anti-inflammatory medication or NSAID. Take with food. Take smallest effective dose for the shortest duration needed for your pain. Stop taking if you experience stomach pain or vomiting.    Oxymetazoline - nasal spray for congestion. Do not use for more than 3 days because this medicine can cause rebound congestion.   Take any prescribed medications only as directed. Treatment for your infection is aimed at treating the symptoms. There are no medications, such as antibiotics, that will cure your infection.   Home care instructions:  Follow any educational materials contained in this packet.   Your illness is contagious and can be spread to others, especially during the first 3 or 4 days. It cannot be cured by antibiotics or other medicines. Take basic precautions such as washing your hands often, covering your mouth when you cough or sneeze, and avoiding public places where you could spread your illness to others.   Please continue  drinking plenty of fluids.  Use over-the-counter medicines as needed as directed on packaging for symptom relief.  You may also use ibuprofen or tylenol as directed on packaging for pain or fever.  Do not take multiple medicines containing Tylenol or acetaminophen to avoid taking too much of this medication.  Follow-up instructions: Please follow-up with your primary care provider in the next 3 days for further evaluation of your symptoms if you are not feeling better.   Return instructions:   Please return to the Emergency Department if you experience worsening symptoms.   RETURN IMMEDIATELY IF you develop shortness of breath, confusion or altered mental status, a new rash, become dizzy, faint, or poorly responsive, or are unable to be cared for at home.  Please return if you have persistent vomiting and cannot keep down fluids or develop a fever that is not controlled by tylenol or motrin.    Please return if you have any other emergent concerns.  Additional Information:  Your vital signs today were: BP 120/86 mmHg   Pulse 81   Temp(Src) 98.1 F (36.7 C) (Oral)   Resp 14   Ht 5\' 11"  (1.803 m)   Wt 220 lb (99.791 kg)   BMI 30.70 kg/m2   SpO2 97% If your blood pressure (BP) was elevated above 135/85 this visit, please have this repeated by your doctor within one month. --------------

## 2014-03-05 NOTE — ED Provider Notes (Signed)
CSN: [id number]     Arrival date & time 03/05/14  [id number] History   First MD Initiated Contact with Patient 03/05/14 0935     Chief Complaint  Patient presents with  . Influenza  . Sore Throat     (Consider location/radiation/quality/duration/timing/severity/associated sxs/prior Treatment) HPI Comments: Patient with smoking history presents with c/o of headache, nasal congestion, sinus pressure, productive cough, sore throat 1 week. He complains of chest soreness with movement of arms and coughing. He has taken over-the-counter cold and flu medications without relief. No fevers, nausea, vomiting, diarrhea. No sick contacts. No skin rashes. The onset of this condition was acute. The course is constant. Alleviating factors: none.     Patient is a 48 y.o. male presenting with flu symptoms and pharyngitis. The history is provided by the patient and medical records.  Influenza Presenting symptoms: cough, fatigue, headache, rhinorrhea and sore throat   Presenting symptoms: no diarrhea, no fever, no myalgias, no nausea, no shortness of breath and no vomiting   Associated symptoms: nasal congestion   Associated symptoms: no chills, no ear pain and no neck stiffness   Sore Throat Associated symptoms include congestion, coughing, fatigue, headaches and a sore throat. Pertinent negatives include no abdominal pain, chills, fever, myalgias, nausea, rash or vomiting.    Past Medical History  Diagnosis Date  . Hypertension    Past Surgical History  Procedure Laterality Date  . Hand surgery     History reviewed. No pertinent family history. History  Substance Use Topics  . Smoking status: Current Every Day Smoker -- 0.50 packs/day  . Smokeless tobacco: Not on file  . Alcohol Use: No    Review of Systems  Constitutional: Positive for fatigue. Negative for fever and chills.  HENT: Positive for congestion, rhinorrhea, sinus pressure and sore throat. Negative for ear discharge and ear pain.    Eyes: Negative for redness.  Respiratory: Positive for cough, chest tightness and wheezing. Negative for shortness of breath.   Gastrointestinal: Negative for nausea, vomiting, abdominal pain and diarrhea.  Genitourinary: Negative for dysuria.  Musculoskeletal: Negative for myalgias and neck stiffness.  Skin: Negative for rash.  Neurological: Positive for headaches.  Hematological: Negative for adenopathy.      Allergies  Review of patient's allergies indicates no known allergies.  Home Medications   Prior to Admission medications   Medication Sig Start Date End Date Taking? Authorizing Provider  amLODipine (NORVASC) 10 MG tablet Take 10 mg by mouth daily.    Historical Provider, MD  carvedilol (COREG) 12.5 MG tablet Take 12.5 mg by mouth 2 (two) times daily with a meal.    Historical Provider, MD  ibuprofen (ADVIL,MOTRIN) 800 MG tablet Take 800 mg by mouth every 8 (eight) hours as needed for moderate pain.    Historical Provider, MD  lisinopril-hydrochlorothiazide (PRINZIDE,ZESTORETIC) 20-25 MG per tablet Take 1 tablet by mouth daily.    Historical Provider, MD  penicillin v potassium (VEETID) 500 MG tablet Take 1 tablet (500 mg total) by mouth 3 (three) times daily. 11/26/11   Fayrene HelperBowie Tran, PA-C  promethazine (PHENERGAN) 25 MG tablet Take 1 tablet (25 mg total) by mouth every 6 (six) hours as needed for nausea. 08/08/11 08/15/11  Elson AreasLeslie K Sofia, PA-C   BP 139/87 mmHg  Pulse 86  Temp(Src) 98.1 F (36.7 C) (Oral)  Resp 14  Ht 5\' 11"  (1.803 m)  Wt 220 lb (99.791 kg)  BMI 30.70 kg/m2  SpO2 96%   Physical Exam  Constitutional: He appears well-developed  and well-nourished.  HENT:  Head: Normocephalic and atraumatic.  Right Ear: Tympanic membrane, external ear and ear canal normal.  Left Ear: Tympanic membrane, external ear and ear canal normal.  Nose: Mucosal edema and rhinorrhea present.  Mouth/Throat: Uvula is midline and mucous membranes are normal. Mucous membranes are not dry.  No trismus in the jaw. No uvula swelling. Posterior oropharyngeal erythema (mild) present. No oropharyngeal exudate, posterior oropharyngeal edema or tonsillar abscesses.  Eyes: Conjunctivae are normal. Right eye exhibits no discharge. Left eye exhibits no discharge.  Neck: Normal range of motion. Neck supple.  Cardiovascular: Normal rate, regular rhythm and normal heart sounds.   No murmur heard. Pulmonary/Chest: Effort normal. No respiratory distress. He has wheezes (scattered, very slight). He has no rales. He exhibits tenderness (anterior chest wall, bilateral, with palpation).  Abdominal: Soft. There is no tenderness.  Musculoskeletal: He exhibits no edema or tenderness.  Lymphadenopathy:    He has cervical adenopathy (Anterior, minimal).  Neurological: He is alert.  Skin: Skin is warm and dry.  Psychiatric: He has a normal mood and affect.  Nursing note and vitals reviewed.   ED Course  Procedures (including critical care time) Labs Review Labs Reviewed  RAPID STREP SCREEN  CULTURE, GROUP A STREP    Imaging Review Dg Chest 2 View  03/05/2014   CLINICAL DATA:  Two week history of chest pain with deep inspiration  EXAM: CHEST  2 VIEW  COMPARISON:  None.  FINDINGS: Lungs are clear. Heart size and pulmonary vascularity are normal. No adenopathy. No pneumothorax. No bone lesions.  IMPRESSION: No edema or consolidation.   Electronically Signed   By: Bretta BangWilliam  Woodruff M.D.   On: 03/05/2014 10:23     EKG Interpretation None       10:02 AM Patient seen and examined. Work-up initiated. Medications ordered.   Vital signs reviewed and are as follows: BP 139/87 mmHg  Pulse 86  Temp(Src) 98.1 F (36.7 C) (Oral)  Resp 14  Ht 5\' 11"  (1.803 m)  Wt 220 lb (99.791 kg)  BMI 30.70 kg/m2  SpO2 96%  10:31 AM patient informed of results. Patient counseled on supportive care for viral URI and s/s to return including worsening symptoms, persistent fever, persistent vomiting, or if they  have any other concerns. Urged to see PCP if symptoms persist for more than 3 days. Patient verbalizes understanding and agrees with plan.   Counseled to return with worsening chest pain, worsening shortness of breath or trouble breathing, fever.    MDM   Final diagnoses:  Pleurisy  Upper respiratory tract infection  Cough   Patient with URI, cough, chest soreness due to coughing. Chest pain is reproducible with movement of upper extremities and palpation of chest wall. Do not suspect ACS, PE. Chest x-ray is negative for pneumonia. Strep test is negative. Will treat patient's symptoms supportively.   No dangerous or life-threatening conditions suspected or identified by history, physical exam, and by work-up. No indications for hospitalization identified.      Renne CriglerJoshua Lakera Viall, PA-C 03/05/14 1032  Lyanne CoKevin M Campos, MD 03/05/14 [phone]

## 2014-03-08 LAB — CULTURE, GROUP A STREP

## 2014-03-18 ENCOUNTER — Encounter (HOSPITAL_COMMUNITY): Payer: Self-pay | Admitting: *Deleted

## 2014-03-18 ENCOUNTER — Emergency Department (HOSPITAL_COMMUNITY): Payer: Self-pay

## 2014-03-18 ENCOUNTER — Emergency Department (HOSPITAL_COMMUNITY)
Admission: EM | Admit: 2014-03-18 | Discharge: 2014-03-18 | Disposition: A | Discharge status: Home / Self Care | Payer: Self-pay | Attending: Emergency Medicine | Admitting: Emergency Medicine

## 2014-03-18 DIAGNOSIS — Z791 Long term (current) use of non-steroidal anti-inflammatories (NSAID): Secondary | ICD-10-CM | POA: Insufficient documentation

## 2014-03-18 DIAGNOSIS — R5383 Other fatigue: Secondary | ICD-10-CM | POA: Insufficient documentation

## 2014-03-18 DIAGNOSIS — I10 Essential (primary) hypertension: Secondary | ICD-10-CM | POA: Insufficient documentation

## 2014-03-18 DIAGNOSIS — R079 Chest pain, unspecified: Secondary | ICD-10-CM | POA: Insufficient documentation

## 2014-03-18 DIAGNOSIS — Z79899 Other long term (current) drug therapy: Secondary | ICD-10-CM | POA: Insufficient documentation

## 2014-03-18 DIAGNOSIS — R05 Cough: Secondary | ICD-10-CM

## 2014-03-18 DIAGNOSIS — J069 Acute upper respiratory infection, unspecified: Secondary | ICD-10-CM | POA: Insufficient documentation

## 2014-03-18 DIAGNOSIS — Z720 Tobacco use: Secondary | ICD-10-CM | POA: Insufficient documentation

## 2014-03-18 DIAGNOSIS — R059 Cough, unspecified: Secondary | ICD-10-CM

## 2014-03-18 LAB — BASIC METABOLIC PANEL
ANION GAP: 9 (ref 5–15)
BUN: 15 mg/dL (ref 6–23)
CO2: 27 mmol/L (ref 19–32)
Calcium: 8.9 mg/dL (ref 8.4–10.5)
Chloride: 99 mEq/L (ref 96–112)
Creatinine, Ser: 0.89 mg/dL (ref 0.50–1.35)
GLUCOSE: 117 mg/dL — AB (ref 70–99)
POTASSIUM: 3.5 mmol/L (ref 3.5–5.1)
SODIUM: 135 mmol/L (ref 135–145)

## 2014-03-18 LAB — CBC WITH DIFFERENTIAL/PLATELET
BASOS ABS: 0 10*3/uL (ref 0.0–0.1)
BASOS PCT: 0 % (ref 0–1)
Eosinophils Absolute: 0.2 10*3/uL (ref 0.0–0.7)
Eosinophils Relative: 2 % (ref 0–5)
HCT: 43.4 % (ref 39.0–52.0)
Hemoglobin: 15 g/dL (ref 13.0–17.0)
LYMPHS ABS: 1.7 10*3/uL (ref 0.7–4.0)
LYMPHS PCT: 23 % (ref 12–46)
MCH: 29.5 pg (ref 26.0–34.0)
MCHC: 34.6 g/dL (ref 30.0–36.0)
MCV: 85.3 fL (ref 78.0–100.0)
MONO ABS: 0.8 10*3/uL (ref 0.1–1.0)
Monocytes Relative: 10 % (ref 3–12)
NEUTROS ABS: 4.9 10*3/uL (ref 1.7–7.7)
NEUTROS PCT: 65 % (ref 43–77)
Platelets: 321 10*3/uL (ref 150–400)
RBC: 5.09 MIL/uL (ref 4.22–5.81)
RDW: 13 % (ref 11.5–15.5)
WBC: 7.6 10*3/uL (ref 4.0–10.5)

## 2014-03-18 LAB — I-STAT TROPONIN, ED: TROPONIN I, POC: 0 ng/mL (ref 0.00–0.08)

## 2014-03-18 MED ORDER — PREDNISONE 20 MG PO TABS: 40.0000 mg | ORAL_TABLET | Freq: Every day | ORAL | Status: DC

## 2014-03-18 MED ORDER — PREDNISONE 20 MG PO TABS
60.0000 mg | ORAL_TABLET | Freq: Once | ORAL | Status: AC
  Administered 2014-03-18: 60 mg via ORAL
  Filled 2014-03-18: qty 3

## 2014-03-18 MED ORDER — ALBUTEROL SULFATE (2.5 MG/3ML) 0.083% IN NEBU
5.0000 mg | INHALATION_SOLUTION | Freq: Once | RESPIRATORY_TRACT | Status: AC
  Administered 2014-03-18: 5 mg via RESPIRATORY_TRACT
  Filled 2014-03-18: qty 6

## 2014-03-18 MED ORDER — ALBUTEROL SULFATE HFA 108 (90 BASE) MCG/ACT IN AERS
1.0000 | INHALATION_SPRAY | RESPIRATORY_TRACT | Status: DC | PRN
  Administered 2014-03-18: 2 via RESPIRATORY_TRACT
  Filled 2014-03-18: qty 6.7

## 2014-03-18 NOTE — ED Provider Notes (Signed)
CSN: [id number]     Arrival date & time 03/18/14  0850 History   First MD Initiated Contact with Patient 03/18/14 [phone]     Chief Complaint  Patient presents with  . Cough  . URI     (Consider location/radiation/quality/duration/timing/severity/associated sxs/prior Treatment) Patient is a 49 y.o. male presenting with cough and URI. The history is provided by the patient and medical records.  Cough Associated symptoms: chest pain, rhinorrhea, shortness of breath and sore throat   URI Presenting symptoms: congestion, cough, fatigue, rhinorrhea and sore throat    This is a 49 y.o. M with PMH significant for HTN, presenting to the ED for URI type symptoms.  Patient states for the past 2 weeks he has been battling URI type symptoms-- productive cough with green sputum, sore throat, nasal congestion, sinus pressure, and generalized fatigue.  Patient seen in the ED on 03/05/14 dx with viral URI and given symptomatic treatment for this.  States initially he did get somewhat better but got a flu shot and then began getting worse again. Patient states symptoms seem much worse than before, now with associated chest pain, SOB, and intermittent wheezing. No cardiac hx, patient is a daily smoker. Patient currently lives in a homeless shelter and has had multiple sick contacts. He denies any fever or chills. He has been taking multiple over-the-counter medications without noted improvement. States he did use his son's albuterol treatment a few days ago with improvement.  Past Medical History  Diagnosis Date  . Hypertension    Past Surgical History  Procedure Laterality Date  . Hand surgery     History reviewed. No pertinent family history. History  Substance Use Topics  . Smoking status: Current Every Day Smoker -- 0.50 packs/day  . Smokeless tobacco: Not on file  . Alcohol Use: No    Review of Systems  Constitutional: Positive for fatigue.  HENT: Positive for congestion, rhinorrhea, sinus  pressure and sore throat.   Respiratory: Positive for cough and shortness of breath.   Cardiovascular: Positive for chest pain.  All other systems reviewed and are negative.     Allergies  Review of patient's allergies indicates no known allergies.  Home Medications   Prior to Admission medications   Medication Sig Start Date End Date Taking? Authorizing Provider  amLODipine (NORVASC) 10 MG tablet Take 10 mg by mouth daily.   Yes Historical Provider, MD  carvedilol (COREG) 12.5 MG tablet Take 12.5 mg by mouth 2 (two) times daily with a meal.   Yes Historical Provider, MD  ibuprofen (ADVIL,MOTRIN) 800 MG tablet Take 800 mg by mouth every 8 (eight) hours as needed for moderate pain.   Yes Historical Provider, MD  lisinopril-hydrochlorothiazide (PRINZIDE,ZESTORETIC) 20-25 MG per tablet Take 1 tablet by mouth daily.   Yes Historical Provider, MD  naproxen (NAPROSYN) 500 MG tablet Take 1 tablet (500 mg total) by mouth 2 (two) times daily. 03/05/14  Yes Renne CriglerJoshua Geiple, PA-C  oxymetazoline (AFRIN NASAL SPRAY) 0.05 % nasal spray Place 1 spray into both nostrils 2 (two) times daily. 03/05/14  Yes Renne CriglerJoshua Geiple, PA-C  benzonatate (TESSALON) 100 MG capsule Take 1 capsule (100 mg total) by mouth every 8 (eight) hours. Patient not taking: Reported on 03/18/2014 03/05/14   Renne CriglerJoshua Geiple, PA-C  promethazine (PHENERGAN) 25 MG tablet Take 1 tablet (25 mg total) by mouth every 6 (six) hours as needed for nausea. 08/08/11 08/15/11  Lonia SkinnerLeslie K Sofia, PA-C   BP 164/93 mmHg  Pulse 105  Temp(Src) 97.4  F (36.3 C) (Oral)  Resp 20  SpO2 92%   Physical Exam  Constitutional: He is oriented to person, place, and time. He appears well-developed and well-nourished.  HENT:  Head: Normocephalic and atraumatic.  Right Ear: Tympanic membrane and ear canal normal.  Left Ear: Tympanic membrane and ear canal normal.  Nose: Right sinus exhibits maxillary sinus tenderness and frontal sinus tenderness. Left sinus exhibits  maxillary sinus tenderness and frontal sinus tenderness.  Mouth/Throat: Uvula is midline and mucous membranes are normal. Posterior oropharyngeal erythema (mild) present. No oropharyngeal exudate, posterior oropharyngeal edema or tonsillar abscesses.  Mild oropharyngeal erythema noted; Tonsils normal in appearance bilaterally without exudate; uvula midline without peritonsillar abscess; handling secretions appropriately; no difficulty swallowing or speaking  Eyes: Conjunctivae and EOM are normal. Pupils are equal, round, and reactive to light.  Neck: Normal range of motion. Neck supple.  Cardiovascular: Normal rate, regular rhythm and normal heart sounds.   Pulmonary/Chest: Effort normal. No accessory muscle usage. He has wheezes. He has no rhonchi. He has no rales.  Respirations unlabored, faint expiratory wheezes at bases; speaking in full complete sentences without difficulty  Abdominal: Soft. Bowel sounds are normal. There is no tenderness. There is no guarding.  Musculoskeletal: Normal range of motion.  Neurological: He is alert and oriented to person, place, and time.  Skin: Skin is warm and dry.  Psychiatric: He has a normal mood and affect.  Nursing note and vitals reviewed.   ED Course  Procedures (including critical care time) Labs Review Labs Reviewed  BASIC METABOLIC PANEL - Abnormal; Notable for the following:    Glucose, Bld 117 (*)    All other components within normal limits  CBC WITH DIFFERENTIAL  Rosezena Sensor, ED    Imaging Review Dg Chest 2 View  03/18/2014   CLINICAL DATA:  Cough and congestion for several weeks. Current smoker.  EXAM: CHEST  2 VIEW  COMPARISON:  03/05/2014  FINDINGS: The cardiomediastinal silhouette is within normal limits. The lungs are well inflated and clear. There is no evidence of pleural effusion or pneumothorax. No acute osseous abnormality is identified.  IMPRESSION: No active cardiopulmonary disease.   Electronically Signed   By: Sebastian Ache   On: 03/18/2014 10:12     EKG Interpretation None      MDM   Final diagnoses:  Cough  URI (upper respiratory infection)   49 y.o. M with 2+ weeks of URI symptoms.  Seen here 03/05/14, initially improved and worsened again.  On exam, patient afebrile and non-toxic in appearance.  Lungs with faint expiratory wheezes at bases.  Reports chest pain and SOB, however appears stable on RA.  EKG NSR, early repolarization pattern.  Will obtain basic labs, troponin, and CXR.  Albuterol neb given.  Lab work reassuring.  Troponin negative.  CXR clear.  After neb treatment patient states he is feeling better.  He has had a few desaturations to 89-91% however these occurred in lying position while patient was sleeping, once awoken and sat upright returned to normal range.  Patient remains without any signs of labored breathing or respiratory distress.  Low suspicion for ACS, PE, dissection, or other acute cardiac process at this time.  Constellation of symptoms likely viral in nature.  Patient given dose of prednisone and albuterol inhaler in the ED.  Will d/c home with steroid taper.  Encouraged symptomatic care.  Discussed plan with patient, he/she acknowledged understanding and agreed with plan of care.  Return precautions given for new  or worsening symptoms.  Garlon Hatchet, PA-C 03/18/14 1343  Benny Lennert, MD 03/18/14 [phone]

## 2014-03-18 NOTE — ED Notes (Signed)
Pt reports having sore throat, cough and cold symptoms. Cough is productive with green sputum which is also causing sob. No resp distress noted at triage.

## 2014-03-18 NOTE — ED Notes (Signed)
Patient transported to X-ray 

## 2014-03-18 NOTE — Discharge Instructions (Signed)
Take the prescribed medication as directed starting tomorrow, you have already had today's dose.  Use inhaler as needed.  Rest and drink plenty of fluids. Return to the ED for new or worsening symptoms.

## 2014-03-18 NOTE — ED Notes (Signed)
PA at bedside.

## 2014-09-13 ENCOUNTER — Emergency Department (HOSPITAL_COMMUNITY)
Admission: EM | Admit: 2014-09-13 | Discharge: 2014-09-13 | Disposition: A | Discharge status: Home / Self Care | Payer: Self-pay | Attending: Emergency Medicine | Admitting: Emergency Medicine

## 2014-09-13 ENCOUNTER — Encounter (HOSPITAL_COMMUNITY): Payer: Self-pay | Admitting: Emergency Medicine

## 2014-09-13 DIAGNOSIS — Z79899 Other long term (current) drug therapy: Secondary | ICD-10-CM | POA: Insufficient documentation

## 2014-09-13 DIAGNOSIS — M5432 Sciatica, left side: Secondary | ICD-10-CM | POA: Insufficient documentation

## 2014-09-13 DIAGNOSIS — Z720 Tobacco use: Secondary | ICD-10-CM | POA: Insufficient documentation

## 2014-09-13 DIAGNOSIS — Z7952 Long term (current) use of systemic steroids: Secondary | ICD-10-CM | POA: Insufficient documentation

## 2014-09-13 DIAGNOSIS — I10 Essential (primary) hypertension: Secondary | ICD-10-CM | POA: Insufficient documentation

## 2014-09-13 DIAGNOSIS — Z791 Long term (current) use of non-steroidal anti-inflammatories (NSAID): Secondary | ICD-10-CM | POA: Insufficient documentation

## 2014-09-13 HISTORY — DX: Dorsalgia, unspecified: M54.9

## 2014-09-13 MED ORDER — PREDNISONE 20 MG PO TABS
60.0000 mg | ORAL_TABLET | Freq: Once | ORAL | Status: AC
  Administered 2014-09-13: 60 mg via ORAL
  Filled 2014-09-13: qty 3

## 2014-09-13 MED ORDER — PREDNISONE 20 MG PO TABS: 40.0000 mg | ORAL_TABLET | Freq: Every day | ORAL | Status: DC

## 2014-09-13 MED ORDER — CYCLOBENZAPRINE HCL 10 MG PO TABS: 10.0000 mg | ORAL_TABLET | Freq: Two times a day (BID) | ORAL | Status: DC | PRN

## 2014-09-13 NOTE — ED Notes (Signed)
Pt stable, ambulatory, states understanding of discharge instructions 

## 2014-09-13 NOTE — ED Provider Notes (Signed)
CSN: [id number]     Arrival date & time 09/13/14  1701 History  This chart was scribed for Roxy Horsemanobert Conleigh Heinlein, PA-C, working with Lorre NickAnthony Allen, MD by Elon SpannerGarrett Cook, ED Scribe. This patient was seen in room TR05C/TR05C and the patient's care was started at 5:22 PM.   Chief Complaint  Patient presents with  . Back Pain   The history is provided by the patient. No language interpreter was used.    HPI Comments: Larry Griffith is a 49 y.o. male who presents to the Emergency Department complaining of a flare of his chronic mid and lower back pain onset two weeks ago.  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  Pt was [facility] for this complaint [facility] where he received an unknown injection which afforded no relief.  He denies history of back surgery.  He denies history of IV drug use, CA.  He denies DM.  He denies fever, bowel/bladder incontinence.    Past Medical History  Diagnosis Date  . Hypertension   . Back pain    Past Surgical History  Procedure Laterality Date  . Hand surgery     No family history on file. History  Substance Use Topics  . Smoking status: Current Every Day Smoker -- 0.50 packs/day  . Smokeless tobacco: Not on file  . Alcohol Use: No    Review of Systems  Constitutional: Negative for fever and chills.  Gastrointestinal:       No bowel incontinence  Genitourinary:       No urinary incontinence  Musculoskeletal: Positive for myalgias, back pain and arthralgias.  Neurological: Negative for weakness and numbness.       No saddle anesthesia      Allergies  Review of patient's allergies indicates no known allergies.  Home Medications   Prior to Admission medications   Medication Sig Start Date End Date Taking? Authorizing Provider  amLODipine (NORVASC) 10 MG tablet Take 10 mg by mouth daily.    Historical Provider, MD  benzonatate (TESSALON) 100 MG capsule Take 1  capsule (100 mg total) by mouth every 8 (eight) hours. Patient not taking: Reported on 03/18/2014 03/05/14   Renne CriglerJoshua Geiple, PA-C  carvedilol (COREG) 12.5 MG tablet Take 12.5 mg by mouth 2 (two) times daily with a meal.    Historical Provider, MD  ibuprofen (ADVIL,MOTRIN) 800 MG tablet Take 800 mg by mouth every 8 (eight) hours as needed for moderate pain.    Historical Provider, MD  lisinopril-hydrochlorothiazide (PRINZIDE,ZESTORETIC) 20-25 MG per tablet Take 1 tablet by mouth daily.    Historical Provider, MD  naproxen (NAPROSYN) 500 MG tablet Take 1 tablet (500 mg total) by mouth 2 (two) times daily. 03/05/14   Renne CriglerJoshua Geiple, PA-C  oxymetazoline (AFRIN NASAL SPRAY) 0.05 % nasal spray Place 1 spray into both nostrils 2 (two) times daily. 03/05/14   Renne CriglerJoshua Geiple, PA-C  predniSONE (DELTASONE) 20 MG tablet Take 2 tablets (40 mg total) by mouth daily. Take 40 mg by mouth daily for 3 days, then 20mg  by mouth daily for 3 days, then 10mg  daily for 3 days 03/18/14   Garlon HatchetLisa M Sanders, PA-C  promethazine (PHENERGAN) 25 MG tablet Take 1 tablet (25 mg total) by mouth every 6 (six) hours as needed for nausea. 08/08/11 08/15/11  Elson AreasLeslie K Sofia, PA-C   BP 135/100 mmHg  Pulse 70  Temp(Src) 98 F (36.7 C) (Oral)  Resp 16  Ht  (1.778 m)  Wt 210 lb (95.255 kg)  BMI 30.13 kg/m2  SpO2 96% Physical Exam  Constitutional: He is oriented to person, place, and time. He appears well-developed and well-nourished. No distress.  HENT:  Head: Normocephalic and atraumatic.  Eyes: Conjunctivae and EOM are normal. Right eye exhibits no discharge. Left eye exhibits no discharge. No scleral icterus.  Neck: Normal range of motion. Neck supple. No tracheal deviation present.  Cardiovascular: Normal rate, regular rhythm and normal heart sounds.  Exam reveals no gallop and no friction rub.   No murmur heard. Pulmonary/Chest: Effort normal and breath sounds normal. No respiratory distress. He has no wheezes.  Abdominal: Soft.  He exhibits no distension. There is no tenderness.  Musculoskeletal: Normal range of motion.  Lumbar paraspinal muscles tender to palpation, no bony tenderness, step-offs, or gross abnormality or deformity of spine, patient is able to ambulate, moves all extremities  Bilateral great toe extension intact Bilateral plantar/dorsiflexion intact  Neurological: He is alert and oriented to person, place, and time.  Sensation and strength intact bilaterally   Skin: Skin is warm. He is not diaphoretic.  Psychiatric: He has a normal mood and affect. His behavior is normal. Judgment and thought content normal.  Nursing note and vitals reviewed.   ED Course  Procedures (including critical care time)  DIAGNOSTIC STUDIES: Oxygen Saturation is 96% on RA, noraml by my interpretation.    COORDINATION OF CARE:  5:28 PM Discussed treatment plan with patient at bedside.  Patient acknowledges and agrees with plan.    Labs Review Labs Reviewed - No data to display  Imaging Review No results found.   EKG Interpretation None      MDM   Final diagnoses:  Sciatica, left    Patient with back pain.  No neurological deficits and normal neuro exam.  Patient is ambulatory.  No loss of bowel or bladder control.  Doubt cauda equina.  Denies fever,  doubt epidural abscess or other lesion. Recommend back exercises, stretching, RICE, and will treat with a short course of prednisone.  Encouraged the patient that there could be a need for additional workup and/or imaging such as MRI, if the symptoms do not resolve. Patient advised that if the back pain does not resolve, or radiates, this could progress to more serious conditions and is encouraged to follow-up with PCP or orthopedics within 2 weeks.    I personally performed the services described in this documentation, which was scribed in my presence. The recorded information has been reviewed and is accurate.     Roxy Horseman, PA-C 09/13/14  1755  Lorre Nick, MD 09/13/14 [phone]

## 2014-09-13 NOTE — ED Notes (Signed)
C/o flare-up of chronic back pain x 2 weeks.  No known recent injury.

## 2014-09-13 NOTE — Discharge Instructions (Signed)
Sciatica Sciatica is pain, weakness, numbness, or tingling along the path of the sciatic nerve. The nerve starts in the lower back and runs down the back of each leg. The nerve controls the muscles in the lower leg and in the back of the knee, while also providing sensation to the back of the thigh, lower leg, and the sole of your foot. Sciatica is a symptom of another medical condition. For instance, nerve damage or certain conditions, such as a herniated disk or bone spur on the spine, pinch or put pressure on the sciatic nerve. This causes the pain, weakness, or other sensations normally associated with sciatica. Generally, sciatica only affects one side of the body. CAUSES   Herniated or slipped disc.  Degenerative disk disease.  A pain disorder involving the narrow muscle in the buttocks (piriformis syndrome).  Pelvic injury or fracture.  Pregnancy.  Tumor (rare). SYMPTOMS  Symptoms can vary from mild to very severe. The symptoms usually travel from the low back to the buttocks and down the back of the leg. Symptoms can include:  Mild tingling or dull aches in the lower back, leg, or hip.  Numbness in the back of the calf or sole of the foot.  Burning sensations in the lower back, leg, or hip.  Sharp pains in the lower back, leg, or hip.  Leg weakness.  Severe back pain inhibiting movement. These symptoms may get worse with coughing, sneezing, laughing, or prolonged sitting or standing. Also, being overweight may worsen symptoms. DIAGNOSIS  Your caregiver will perform a physical exam to look for common symptoms of sciatica. He or she may ask you to do certain movements or activities that would trigger sciatic nerve pain. Other tests may be performed to find the cause of the sciatica. These may include:  Blood tests.  X-rays.  Imaging tests, such as an MRI or CT scan. TREATMENT  Treatment is directed at the cause of the sciatic pain. Sometimes, treatment is not necessary  and the pain and discomfort goes away on its own. If treatment is needed, your caregiver may suggest:  Over-the-counter medicines to relieve pain.  Prescription medicines, such as anti-inflammatory medicine, muscle relaxants, or narcotics.  Applying heat or ice to the painful area.  Steroid injections to lessen pain, irritation, and inflammation around the nerve.  Reducing activity during periods of pain.  Exercising and stretching to strengthen your abdomen and improve flexibility of your spine. Your caregiver may suggest losing weight if the extra weight makes the back pain worse.  Physical therapy.  Surgery to eliminate what is pressing or pinching the nerve, such as a bone spur or part of a herniated disk. HOME CARE INSTRUCTIONS   Only take over-the-counter or prescription medicines for pain or discomfort as directed by your caregiver.  Apply ice to the affected area for 20 minutes, 3-4 times a day for the first 48-72 hours. Then try heat in the same way.  Exercise, stretch, or perform your usual activities if these do not aggravate your pain.  Attend physical therapy sessions as directed by your caregiver.  Keep all follow-up appointments as directed by your caregiver.  Do not wear high heels or shoes that do not provide proper support.  Check your mattress to see if it is too soft. A firm mattress may lessen your pain and discomfort. SEEK IMMEDIATE MEDICAL CARE IF:   You lose control of your bowel or bladder (incontinence).  You have increasing weakness in the lower back, pelvis, buttocks,   or legs.  You have redness or swelling of your back.  You have a burning sensation when you urinate.  You have pain that gets worse when you lie down or awakens you at night.  Your pain is worse than you have experienced in the past.  Your pain is lasting longer than 4 weeks.  You are suddenly losing weight without reason. MAKE SURE YOU:  Understand these  instructions.  Will watch your condition.  Will get help right away if you are not doing well or get worse. Document Released: 02/14/2001 Document Revised: 08/22/2011 Document Reviewed: 07/02/2011 [employer] Patient Information 2015 [employer]. This information is not intended to replace advice given to you by your health care provider. Make sure you discuss any questions you have with your health care provider.  

## 2015-01-21 ENCOUNTER — Encounter (HOSPITAL_COMMUNITY): Payer: Self-pay | Admitting: Neurology

## 2015-01-21 ENCOUNTER — Emergency Department (HOSPITAL_COMMUNITY)
Admission: EM | Admit: 2015-01-21 | Discharge: 2015-01-21 | Disposition: A | Discharge status: Home / Self Care | Payer: Self-pay | Attending: Emergency Medicine | Admitting: Emergency Medicine

## 2015-01-21 DIAGNOSIS — G8929 Other chronic pain: Secondary | ICD-10-CM

## 2015-01-21 DIAGNOSIS — Z8659 Personal history of other mental and behavioral disorders: Secondary | ICD-10-CM | POA: Insufficient documentation

## 2015-01-21 DIAGNOSIS — M62830 Muscle spasm of back: Secondary | ICD-10-CM | POA: Insufficient documentation

## 2015-01-21 DIAGNOSIS — I10 Essential (primary) hypertension: Secondary | ICD-10-CM | POA: Insufficient documentation

## 2015-01-21 DIAGNOSIS — M549 Dorsalgia, unspecified: Secondary | ICD-10-CM

## 2015-01-21 DIAGNOSIS — M545 Low back pain: Secondary | ICD-10-CM | POA: Insufficient documentation

## 2015-01-21 DIAGNOSIS — F17200 Nicotine dependence, unspecified, uncomplicated: Secondary | ICD-10-CM | POA: Insufficient documentation

## 2015-01-21 HISTORY — DX: Post-traumatic stress disorder, unspecified: F43.10

## 2015-01-21 HISTORY — DX: Bipolar disorder, unspecified: F31.9

## 2015-01-21 NOTE — ED Notes (Signed)
Pt reports lower back pain for several years, has ruptured disk . On Tuesday went to family practice a [facility] and was given shot for pain. No relief, is not taking anything for pain. Pt is ambulatory.

## 2015-01-21 NOTE — Discharge Instructions (Signed)
Back Pain: Your back pain should be treated with medicines such as ibuprofen or aleve and this back pain should get better over the next 2 weeks.  However if you develop severe or worsening pain, low back pain with fever, numbness, weakness or inability to walk or urinate, you should return to the ER immediately.  Please follow up with your doctor this week for a recheck if still having symptoms.  Avoid heavy lifting over 10 pounds over the next two weeks.  Low back pain is discomfort in the lower back that may be due to injuries to muscles and ligaments around the spine.  Occasionally, it may be caused by a a problem to a part of the spine called a disc.  The pain may last several days or a week;  However, most patients get completely well in 4 weeks.  Self - care:  The application of heat can help soothe the pain.  Maintaining your daily activities, including walking, is encourged, as it will help you get better faster than just staying in bed. Perform gentle stretching as discussed. Drink plenty of fluids.  Medications are also useful to help with pain control.  A commonly prescribed medication includes tylenol.  Non steroidal anti inflammatory medications including Ibuprofen and naproxen;  These medications help both pain and swelling and are very useful in treating back pain.  They should be taken with food, as they can cause stomach upset, and more seriously, stomach bleeding.     SEEK IMMEDIATE MEDICAL ATTENTION IF: New numbness, tingling, weakness, or problem with the use of your arms or legs.  Severe back pain not relieved with medications.  Difficulty with or loss of control of your bowel or bladder control.  Increasing pain in any areas of the body (such as chest or abdominal pain).  Shortness of breath, dizziness or fainting.  Nausea (feeling sick to your stomach), vomiting, fever, or sweats.  You will need to follow up with  Your primary healthcare provider in 1-2 weeks for  reassessment. Use the list below to find a chronic pain specialist.  Chronic Back Pain  When back pain lasts longer than 3 months, it is called chronic back pain.People with chronic back pain often go through certain periods that are more intense (flare-ups).  CAUSES Chronic back pain can be caused by wear and tear (degeneration) on different structures in your back. These structures include: 1. The bones of your spine (vertebrae) and the joints surrounding your spinal cord and nerve roots (facets). 2. The strong, fibrous tissues that connect your vertebrae (ligaments). Degeneration of these structures may result in pressure on your nerves. This can lead to constant pain. HOME CARE INSTRUCTIONS 1. Avoid bending, heavy lifting, prolonged sitting, and activities which make the problem worse. 2. Take brief periods of rest throughout the day to reduce your pain. Lying down or standing usually is better than sitting while you are resting. 3. Take over-the-counter or prescription medicines only as directed by your caregiver. SEEK IMMEDIATE MEDICAL CARE IF:  1. You have weakness or numbness in one of your legs or feet. 2. You have trouble controlling your bladder or bowels. 3. You have nausea, vomiting, abdominal pain, shortness of breath, or fainting.   This information is not intended to replace advice given to you by your health care provider. Make sure you discuss any questions you have with your health care provider.   Document Released: 03/30/2004 Document Revised: 05/15/2011 Document Reviewed: 08/10/2014 Elsevier Interactive Patient Education 2016  [place] Injury Prevention Back injuries can be very painful. They can also be difficult to heal. After having one back injury, you are more likely to injure your back again. It is important to learn how to avoid injuring or re-injuring your back. The following tips can help you to prevent a back injury. WHAT SHOULD I KNOW ABOUT  PHYSICAL FITNESS? 3. Exercise for 30 minutes per day on most days of the week or as told by your doctor. Make sure to: 1. Do aerobic exercises, such as walking, jogging, biking, or swimming. 2. Do exercises that increase balance and strength, such as tai chi and yoga. 3. Do stretching exercises. This helps with flexibility. 4. Try to develop strong belly (abdominal) muscles. Your belly muscles help to support your back. 4. Stay at a healthy weight. This helps to decrease your risk of a back injury. WHAT SHOULD I KNOW ABOUT MY DIET? 4. Talk with your doctor about your overall diet. Take supplements and vitamins only as told by your doctor. 5. Talk with your doctor about how much calcium and vitamin D you need each day. These nutrients help to prevent weakening of the bones (osteoporosis). 6. Include good sources of calcium in your diet, such as: 1. Dairy products. 2. Green leafy vegetables. 3. Products that have had calcium added to them (fortified). 7. Include good sources of vitamin D in your diet, such as: 1. Milk. 2. Foods that have had vitamin D added to them. WHAT SHOULD I KNOW ABOUT MY POSTURE? 4. Sit up straight and stand up straight. Avoid leaning forward when you sit or hunching over when you stand. 5. Choose chairs that have good low-back (lumbar) support. 6. If you work at a desk, sit close to it so you do not need to lean over. Keep your chin tucked in. Keep your neck drawn back. Keep your elbows bent so your arms look like the letter "L" (right angle). 7. Sit high and close to the steering wheel when you drive. Add a low-back support to your car seat, if needed. 8. Avoid sitting or standing in one position for very long. Take breaks to get up, stretch, and walk around at least one time every hour. Take breaks every hour if you are driving for long periods of time. 9. Sleep on your side with your knees slightly bent, or sleep on your back with a pillow under your knees. Do not lie  on the front of your body to sleep. WHAT SHOULD I KNOW ABOUT LIFTING, TWISTING, AND REACHING Lifting and Heavy Lifting 1. Avoid heavy lifting, especially lifting over and over again. If you must do heavy lifting: 1. Stretch before lifting. 2. Work slowly. 3. Rest between lifts. 4. Use a tool such as a cart or a dolly to move objects if one is available. 5. Make several small trips instead of carrying one heavy load. 6. Ask for help when you need it, especially when moving big objects. 2. Follow these steps when lifting: 1. Stand with your feet shoulder-width apart. 2. Get as close to the object as you can. Do not pick up a heavy object that is far from your body. 3. Use handles or lifting straps if they are available. 4. Bend at your knees. Squat down, but keep your heels off the floor. 5. Keep your shoulders back. Keep your chin tucked in. Keep your back straight. 6. Lift the object slowly while you tighten the muscles in your legs, belly, and  butt. Keep the object as close to the center of your body as possible. 3. Follow these steps when putting down a heavy load: 1. Stand with your feet shoulder-width apart. 2. Lower the object slowly while you tighten the muscles in your legs, belly, and butt. Keep the object as close to the center of your body as possible. 3. Keep your shoulders back. Keep your chin tucked in. Keep your back straight. 4. Bend at your knees. Squat down, but keep your heels off the floor. 5. Use handles or lifting straps if they are available. Twisting and Reaching 1. Avoid lifting heavy objects above your waist. 2. Do not twist at your waist while you are lifting or carrying a load. If you need to turn, move your feet. 3. Do not bend over without bending at your knees. 4. Avoid reaching over your head, across a table, or for an object on a high surface.  WHAT ARE SOME OTHER TIPS? 1. Avoid wet floors and icy ground. Keep sidewalks clear of ice to prevent falls.   2. Do not sleep on a mattress that is too soft or too hard.  3. Keep items that you use often within easy reach.  4. Put heavier objects on shelves at waist level, and put lighter objects on lower or higher shelves. 5. Find ways to lower your stress, such as: 1. Exercise. 2. Massage. 3. Relaxation techniques. 6. Talk with your doctor if you feel anxious or depressed. These conditions can make back pain worse. 7. Wear flat heel shoes with cushioned soles. 8. Avoid making quick (sudden) movements. 9. Use both shoulder straps when carrying a backpack. 10. Do not use any tobacco products, including cigarettes, chewing tobacco, or electronic cigarettes. If you need help quitting, ask your doctor.   This information is not intended to replace advice given to you by your health care provider. Make sure you discuss any questions you have with your health care provider.   Document Released: 08/09/2007 Document Revised: 07/07/2014 Document Reviewed: 02/24/2014 Elsevier Interactive Patient Education 2016 [place].  Back Exercises If you have pain in your back, do these exercises 2-3 times each day or as told by your doctor. When the pain goes away, do the exercises once each day, but repeat the steps more times for each exercise (do more repetitions). If you do not have pain in your back, do these exercises once each day or as told by your doctor. EXERCISES Single Knee to Chest Do these steps 3-5 times in a row for each leg: 5. Lie on your back on a firm bed or the floor with your legs stretched out. 6. Bring one knee to your chest. 7. Hold your knee to your chest by grabbing your knee or thigh. 8. Pull on your knee until you feel a gentle stretch in your lower back. 9. Keep doing the stretch for 10-30 seconds. 10. Slowly let go of your leg and straighten it. Pelvic Tilt Do these steps 5-10 times in a row: 8. Lie on your back on a firm bed or the floor with your legs stretched  out. 9. Bend your knees so they point up to the ceiling. Your feet should be flat on the floor. 10. Tighten your lower belly (abdomen) muscles to press your lower back against the floor. This will make your tailbone point up to the ceiling instead of pointing down to your feet or the floor. 11. Stay in this position for 5-10 seconds while you gently  tighten your muscles and breathe evenly. Cat-Cow Do these steps until your lower back bends more easily: 10. Get on your hands and knees on a firm surface. Keep your hands under your shoulders, and keep your knees under your hips. You may put padding under your knees. 11. Let your head hang down, and make your tailbone point down to the floor so your lower back is round like the back of a cat. 12. Stay in this position for 5 seconds. 13. Slowly lift your head and make your tailbone point up to the ceiling so your back hangs low (sags) like the back of a cow. 14. Stay in this position for 5 seconds. Press-Ups Do these steps 5-10 times in a row: 4. Lie on your belly (face-down) on the floor. 5. Place your hands near your head, about shoulder-width apart. 6. While you keep your back relaxed and keep your hips on the floor, slowly straighten your arms to raise the top half of your body and lift your shoulders. Do not use your back muscles. To make yourself more comfortable, you may change where you place your hands. 7. Stay in this position for 5 seconds. 8. Slowly return to lying flat on the floor. Bridges Do these steps 10 times in a row: 5. Lie on your back on a firm surface. 6. Bend your knees so they point up to the ceiling. Your feet should be flat on the floor. 7. Tighten your butt muscles and lift your butt off of the floor until your waist is almost as high as your knees. If you do not feel the muscles working in your butt and the back of your thighs, slide your feet 1-2 inches farther away from your butt. 8. Stay in this position for 3-5  seconds. 9. Slowly lower your butt to the floor, and let your butt muscles relax. If this exercise is too easy, try doing it with your arms crossed over your chest. Belly Crunches Do these steps 5-10 times in a row: 11. Lie on your back on a firm bed or the floor with your legs stretched out. 12. Bend your knees so they point up to the ceiling. Your feet should be flat on the floor. 58. Cross your arms over your chest. 14. Tip your chin a little bit toward your chest but do not bend your neck. 55. Tighten your belly muscles and slowly raise your chest just enough to lift your shoulder blades a tiny bit off of the floor. 16. Slowly lower your chest and your head to the floor. Back Lifts Do these steps 5-10 times in a row: 1. Lie on your belly (face-down) with your arms at your sides, and rest your forehead on the floor. 2. Tighten the muscles in your legs and your butt. 3. Slowly lift your chest off of the floor while you keep your hips on the floor. Keep the back of your head in line with the curve in your back. Look at the floor while you do this. 4. Stay in this position for 3-5 seconds. 5. Slowly lower your chest and your face to the floor. GET HELP IF:  Your back pain gets a lot worse when you do an exercise.  Your back pain does not lessen 2 hours after you exercise. If you have any of these problems, stop doing the exercises. Do not do them again unless your doctor says it is okay. GET HELP RIGHT AWAY IF:  You have sudden, very bad  back pain. If this happens, stop doing the exercises. Do not do them again unless your doctor says it is okay.   This information is not intended to replace advice given to you by your health care provider. Make sure you discuss any questions you have with your health care provider.   Document Released: 03/25/2010 Document Revised: 11/11/2014 Document Reviewed: 04/16/2014 Elsevier Interactive Patient Education 2016 [employer].  Emergency  Department Resource Guide 1) Find a Doctor and Pay Out of Pocket Although you won't have to find out who is covered by your insurance plan, it is a good idea to ask around and get recommendations. You will then need to call the office and see if the doctor you have chosen will accept you as a new patient and what types of options they offer for patients who are self-pay. Some doctors offer discounts or will set up payment plans for their patients who do not have insurance, but you will need to ask so you aren't surprised when you get to your appointment.  2) Contact Your Local Health Department Not all health departments have doctors that can see patients for sick visits, but many do, so it is worth a call to see if yours does. If you don't know where your local health department is, you can check in your phone book. The CDC also has a tool to help you locate your state's health department, and many state websites also have listings of all of their local health departments.  3) Find a [facility] If your illness is not likely to be very severe or complicated, you may want to try a walk in clinic. These are popping up all over the country in pharmacies, drugstores, and shopping centers. They're usually staffed by nurse practitioners or physician assistants that have been trained to treat common illnesses and complaints. They're usually fairly quick and inexpensive. However, if you have serious medical issues or chronic medical problems, these are probably not your best option.  No Primary Care Doctor: - Call Health Connect at  [phone] - they can help you locate a primary care doctor that  accepts your insurance, provides certain services, etc. - Physician Referral Service- [phone]  Chronic Pain Problems: Organization         Address  Phone   Notes  [facility]  [phone] Patients need to be referred by their primary care doctor.   Medication  Assistance: Organization         Address  Phone   Notes  [facility] Medication [facility] [address] [place], [place] [address] [phone] --Must be a resident of [facility] -- Must have NO insurance coverage whatsoever (no Medicaid/ Medicare, etc.) -- The pt. MUST have a primary care doctor that directs their care regularly and follows them in the community   MedAssist  [phone]   [employer]  [phone]    [place] in the Community: Intensive Outpatient Programs Organization         Address  Phone  Notes  [place]. [address], [place], [place] [phone]   [facility] Outpatient [address], [place], [place]   ADS: Alcohol & Drug Svcs [address] [place], [place]   [place] [address]  [place], [place] or [phone]   Substance Abuse Resources Organization  Address  Phone  Notes  Alcohol and Drug Services  [phone]   [place]  [phone]   [facility] [place]  [phone]   Chinita Pester  [phone]   Residential & Outpatient Substance Abuse Program  [phone]   Psychological Services Organization         Address  Phone  Notes  [facility]  [place]  [phone]   [place] [address] [place] or [phone]    Mobile Crisis Teams Organization         Address  Phone  Notes  [facility], Mobile Crisis Care Unit  [phone]   Assertive Psychotherapeutic Services  [address] [place], [place]   [place] [address] [place] [phone]    Self-Help/Support Groups Organization         Address  Phone             Notes  [place] [place] - variety of support groups  [place] Call for more information   Narcotics Anonymous (NA), [facility] [address], [employer] [place]  2 meetings at this location   Special educational needs teacher         Address  Phone  Notes  [facility] Residential Treatment [place],    [place]  [phone]   [facility]  [address] [place] 6[address], [place], [place]   [place], [place] [phone] Admissions: 8am-3pm M-F  Incentives Substance [place] [address]    [place], [place] [phone]   [facility] [address] [place], [place], [place]   [facility] [address]  [place], [place]   [facility] - Intensive Outpatient [address], [place], [place]   [facility] ([place] [address]  [place], [place] [phone] or [phone]   [facility]idential Treatment Services (RTS) [address], [place], [place] Accepts Medicaid  [facility] [place] [address]  [place] [phone] Substance Abuse/Addiction Treatment   [facility] Organization         Address  Phone  Notes  [facility]  [phone]   Domenic Schwab, PhD [address] Arlis Porta [place], [place]   [phone] or [phone]   [place], [place] [phone]   [facility] [address] [place], [place] [phone] Insurance/Medicaid/sponsorship through [facility] and Families [address], [address]                                    [place], [place] [phone] [place] [address], [place] [phone]    Dr. Adele Schilder  [phone]   [facility] Dept. 1) [address] [place] 2) [place] 3)  [address], [place] [phone]  [phone]   [place] [phone] or [phone] (After Hours)      [place] in the [facility]  Intensive Outpatient Programs: [facility]      [place]. [place]  [place], [place] Both a day and evening program       [facility] Outpatient     [address]        [place], [place] [address] [phone]         ADS: Alcohol & Drug Svcs [place]: [phone] or [phone] [address] [place], [place] [address] [email or web address]  Mobile Crisis Teams:                                        [facility]         Mobile Crisis Care Unit [phone]             Assertive Psychotherapeutic Services [address] Lady Gary [place] [address] [place] [phone]  Self-Help/Support Groups: [facility] of [employer] of support groups [phone] (call for more info)  Narcotics Anonymous (NA) [facility] [address] [place] - 2 meetings at this location  Residential Treatment Programs:  [place]       [place] [address], [place], [place]  [address] [place]  [address] [place], [place] [address] [phone] Admissions: 8am-3pm M-F  Incentives Substance [place]     [address] [place],  [address]       [phone]         [facility] [place] [address] Jadene Pierini [place], [place]  [facility] [address] [place], [place]  [facility] - Intensive  Outpatient      [address] [place] [address]     [place], [place]         [facility] ([place]     [place], [place] or [phone]  Residential Treatment Services (RTS)  [place], [place]  Fellowship [address]                                               [place]  [facility] Resources: [phone]               General Therapy                                                Domenic Schwab, PhD        [address]  [place], [place] [address]         [place]   [address] [place], [place] [address] [phone]  [facility] [address] [place], [place] [address] [phone] Insurance/Medicaid/sponsorship through [facility] and Families                                              [address] [place]                                        [place], [place] [address]    Therapy/tele-psych/case         [place] [address], [place]  [address]  Adolescent/group home/case management [phone]                                           Rosette Reveal PhD       General therapy       Insurance   [phone]         Dr. Adele Schilder Insurance [phone] M-F  [facility]Residential Medicaid, sponsorship [phone]

## 2015-01-21 NOTE — ED Notes (Signed)
Secondary assessment being completed and documented by PA 

## 2015-01-21 NOTE — ED Provider Notes (Signed)
CSN: [id number]   Arrival date & time 01/21/15 1213  History  By signing my name below, I, Bethel BornBritney McCollum, attest that this documentation has been prepared under the direction and in the presence of Levi StraussMercedes Camprubi-Soms PA-C Electronically Signed: Bethel BornBritney McCollum, ED Scribe. 01/21/2015. 1:28 PM. Chief Complaint  Patient presents with  . Back Pain    HPI Patient is a 49 y.o. male presenting with back pain. The history is provided by the patient. No language interpreter was used.  Back Pain Location:  Lumbar spine Quality: sharp. Radiates to:  R posterior upper leg (and lower leg) Pain severity:  Severe Pain is:  Same all the time Onset quality:  Gradual Duration: several years. Timing:  Constant Progression:  Unchanged Chronicity:  Chronic Context: not falling, not jumping from heights, not lifting heavy objects, not MCA, not MVA, not occupational injury, not pedestrian accident, not recent illness, not recent injury and not twisting   Relieved by:  Nothing Worsened by:  Nothing tried Ineffective treatments:  Cold packs, heating pad, OTC medications and NSAIDs Associated symptoms: leg pain   Associated symptoms: no abdominal pain, no bladder incontinence, no bowel incontinence, no chest pain, no dysuria, no fever, no numbness, no paresthesias, no pelvic pain, no perianal numbness, no tingling and no weakness   Risk factors: no recent surgery    Marcelene Butterygve Laural BenesJohnson is a 49 y.o. male with a PMHx of HTN, bipolar, PTSD, and chronic back pain, who presents to the Emergency Department complaining of chronic right lower back pain with onset several years ago. He has had no recent trauma, strain, or injury. He describes the pain as 15/10 constant sharp radiating down the posterior right leg to the ankle, Movement exacerbates the pain, and Motrin, Tylenol, heat application, and Biofreeze have provided insufficient pain relief at home. Pt denies fever, chills, CP, SOB, abd pain, n/v/d/c, saddle  anesthesia, incontinence of bowel or bladder,  hematuria, dysuria, numbness, tingling, weakness.   Past Medical History  Diagnosis Date  . Hypertension   . Back pain   . PTSD (post-traumatic stress disorder)   . Bipolar disorder [facility](HCC)     Past Surgical History  Procedure Laterality Date  . Hand surgery      No family history on file.  Social History  Substance Use Topics  . Smoking status: Current Every Day Smoker -- 0.50 packs/day  . Smokeless tobacco: None  . Alcohol Use: No     Review of Systems  Constitutional: Negative for fever and chills.  Respiratory: Negative for shortness of breath.   Cardiovascular: Negative for chest pain.  Gastrointestinal: Negative for nausea, vomiting, abdominal pain, diarrhea, constipation and bowel incontinence.  Genitourinary: Negative for bladder incontinence, dysuria, hematuria, difficulty urinating (no incontinence) and pelvic pain.  Musculoskeletal: Positive for back pain.  Skin: Negative for color change.  Allergic/Immunologic: Negative for immunocompromised state.  Neurological: Negative for tingling, weakness, numbness and paresthesias.  10 Systems reviewed and are negative for acute change except as noted in the HPI.     Home Medications   Prior to Admission medications   Medication Sig Start Date End Date Taking? Authorizing Provider  amLODipine (NORVASC) 10 MG tablet Take 10 mg by mouth daily.    Historical Provider, MD  benzonatate (TESSALON) 100 MG capsule Take 1 capsule (100 mg total) by mouth every 8 (eight) hours. Patient not taking: Reported on 03/18/2014 03/05/14   Renne CriglerJoshua Geiple, PA-C  carvedilol (COREG) 12.5 MG tablet Take 12.5 mg by mouth 2 (two) times  daily with a meal.    Historical Provider, MD  cyclobenzaprine (FLEXERIL) 10 MG tablet Take 1 tablet (10 mg total) by mouth 2 (two) times daily as needed for muscle spasms. 09/13/14   Roxy Horseman, PA-C  ibuprofen (ADVIL,MOTRIN) 800 MG tablet Take 800 mg by mouth every 8  (eight) hours as needed for moderate pain.    Historical Provider, MD  lisinopril-hydrochlorothiazide (PRINZIDE,ZESTORETIC) 20-25 MG per tablet Take 1 tablet by mouth daily.    Historical Provider, MD  naproxen (NAPROSYN) 500 MG tablet Take 1 tablet (500 mg total) by mouth 2 (two) times daily. 03/05/14   Renne Crigler, PA-C  oxymetazoline (AFRIN NASAL SPRAY) 0.05 % nasal spray Place 1 spray into both nostrils 2 (two) times daily. 03/05/14   Renne Crigler, PA-C  predniSONE (DELTASONE) 20 MG tablet Take 2 tablets (40 mg total) by mouth daily. 09/13/14   Roxy Horseman, PA-C  promethazine (PHENERGAN) 25 MG tablet Take 1 tablet (25 mg total) by mouth every 6 (six) hours as needed for nausea. 08/08/11 08/15/11  Elson Areas, PA-C    Allergies  Review of patient's allergies indicates no known allergies.  Triage Vitals: BP 140/99 mmHg  Pulse 77  Temp(Src) 97.7 F (36.5 C) (Oral)  Resp 18  SpO2 97%  Physical Exam  Constitutional: He is oriented to person, place, and time. Vital signs are normal. He appears well-developed and well-nourished.  Non-toxic appearance. No distress.  Afebrile, nontoxic, NAD  HENT:  Head: Normocephalic and atraumatic.  Mouth/Throat: Mucous membranes are normal.  Eyes: Conjunctivae and EOM are normal. Right eye exhibits no discharge. Left eye exhibits no discharge.  Neck: Normal range of motion. Neck supple.  Cardiovascular: Normal rate.   Pulmonary/Chest: Effort normal. No respiratory distress.  Abdominal: Normal appearance. He exhibits no distension.  Musculoskeletal: Normal range of motion.       Lumbar back: He exhibits tenderness and spasm. He exhibits normal range of motion, no bony tenderness and no deformity.       Back:  Lumbar spine with FROM intact without spinous process TTP, no bony stepoffs or deformities, with mild right-sided paraspinous muscle TTP and  muscle spasms. Strength 5/5 in all extremities, sensation grossly intact in all extremities,  negative SLR bilaterally, gait steady and nonantalgic. No overlying skin changes.   Neurological: He is alert and oriented to person, place, and time. He has normal strength. No sensory deficit.  Skin: Skin is warm, dry and intact. No rash noted.  Psychiatric: He has a normal mood and affect.  Nursing note and vitals reviewed.   ED Course  Procedures  DIAGNOSTIC STUDIES: Oxygen Saturation is 97% on RA,  normal by my interpretation.    COORDINATION OF CARE: 1:25 PM Discussed treatment plan with pt at bedside and pt agreed to the plan.  Labs Review- Labs Reviewed - No data to display  Imaging Review No results found.  MDM   Final diagnoses:  Chronic back pain    49 y.o. male here with chronic back pain. No red flag s/s of low back pain. No s/s of central cord compression or cauda equina. Lower extremities are neurovascularly intact and patient is ambulating without difficulty. No midline tenderness.  Patient was counseled on back pain precautions and told to do activity as tolerated but do not lift, push, or pull heavy objects more than 10 pounds for the next week. Patient counseled to use ice or heat on back for no longer than 15 minutes every hour.  Discussed  tylenol/motrin for pain. Given chronic pain management clinic resources to follow up with, given that this is chronic. No narcotics given.  Patient urged to follow-up with PCP if pain does not improve with treatment and rest or if pain becomes recurrent. Urged to return with worsening severe pain, loss of bowel or bladder control, trouble walking. The patient verbalizes understanding and agrees with the plan.   I personally performed the services described in this documentation, which was scribed in my presence. The recorded information has been reviewed and is accurate.  BP 140/99 mmHg  Pulse 77  Temp(Src) 97.7 F (36.5 C) (Oral)  Resp 18  SpO2 97%  No orders of the defined types were placed in this encounter.         [address] [place], PA-C 01/21/15 1332  Linwood Dibbles, MD 01/22/15 [phone]

## 2016-07-11 IMAGING — CR DG CHEST 2V
2 series · 2 of 2 positions shown · non-contrast
Comparison: None.

CLINICAL DATA: Two week history of chest pain with deep inspiration

EXAM:
CHEST  2 VIEW

[chest pa]
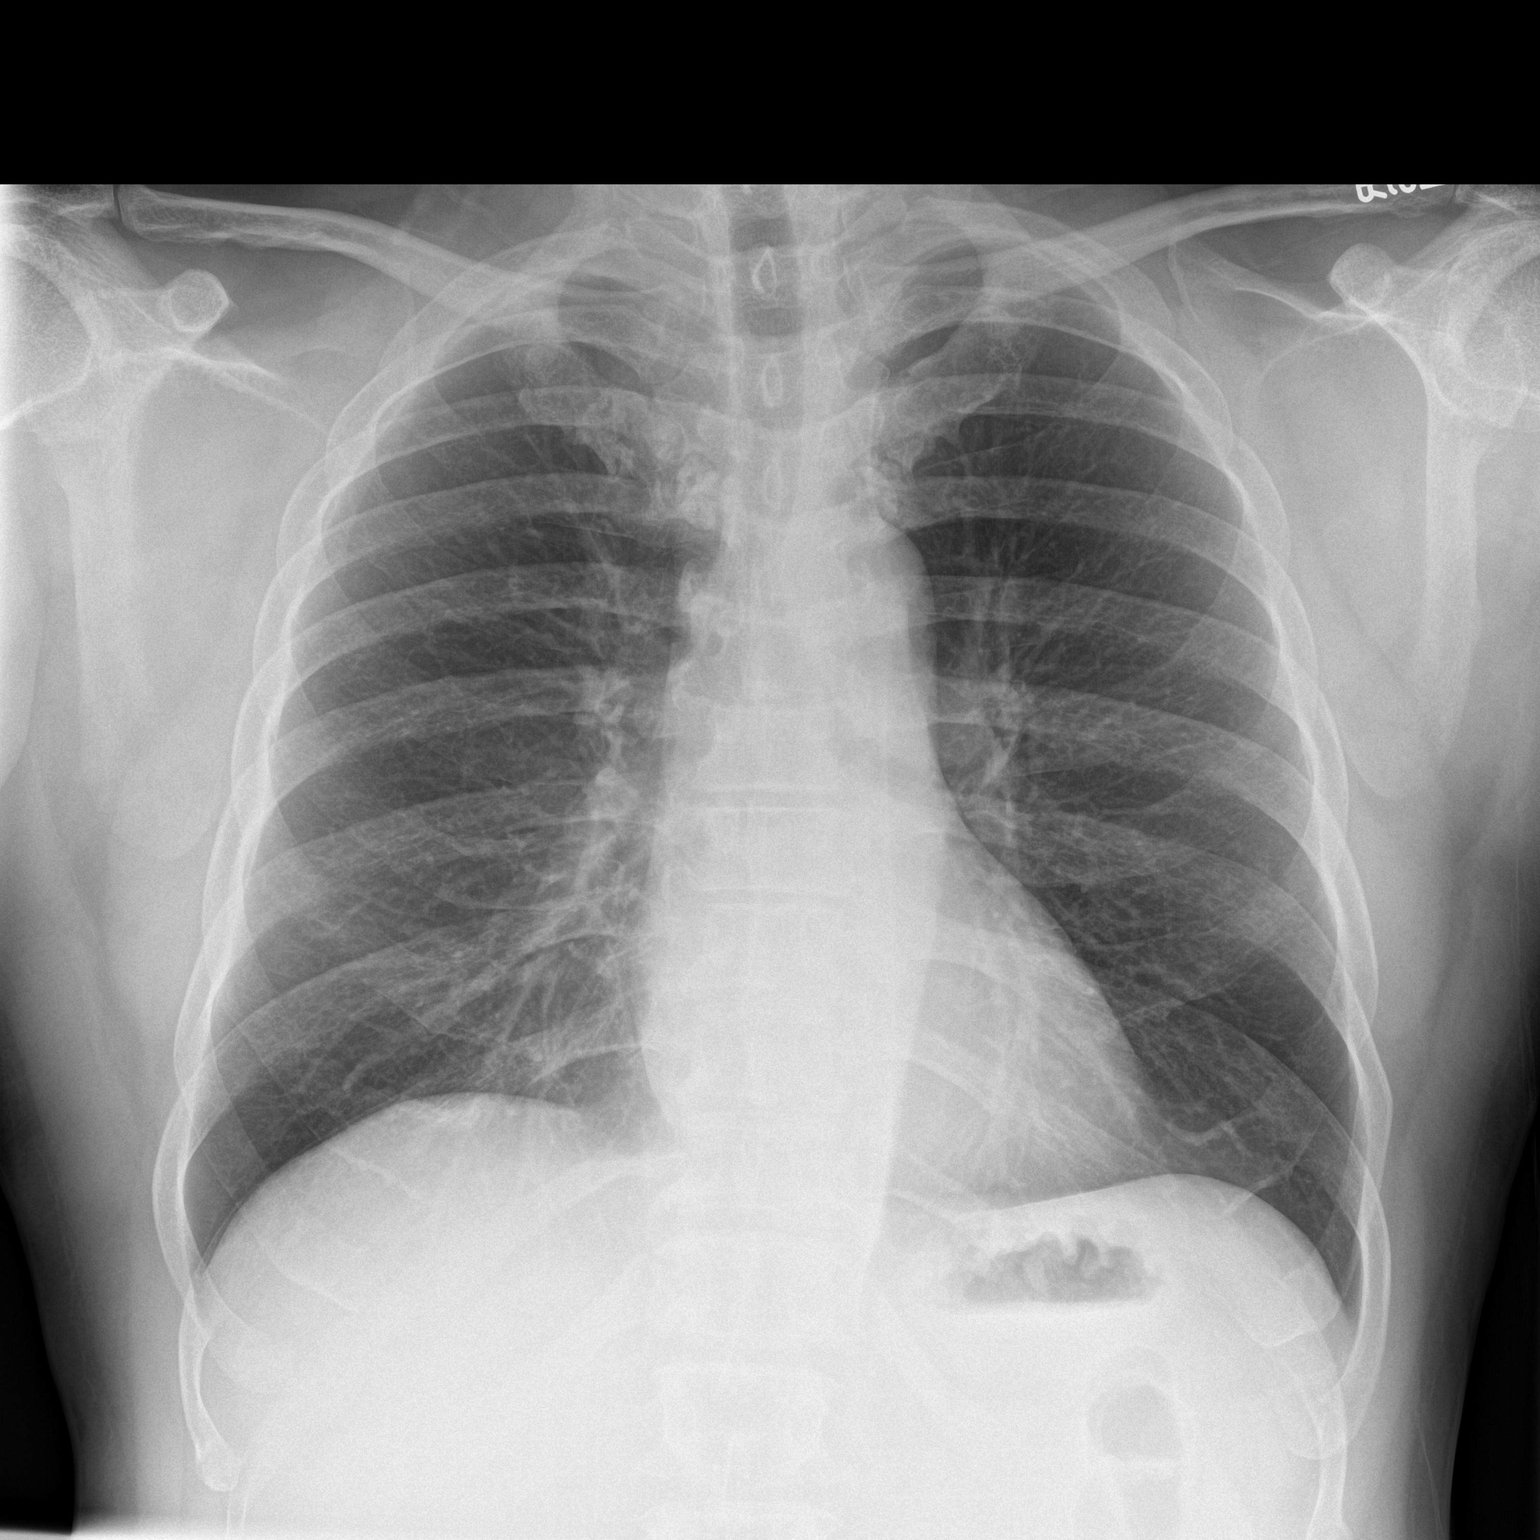

[chest lat]
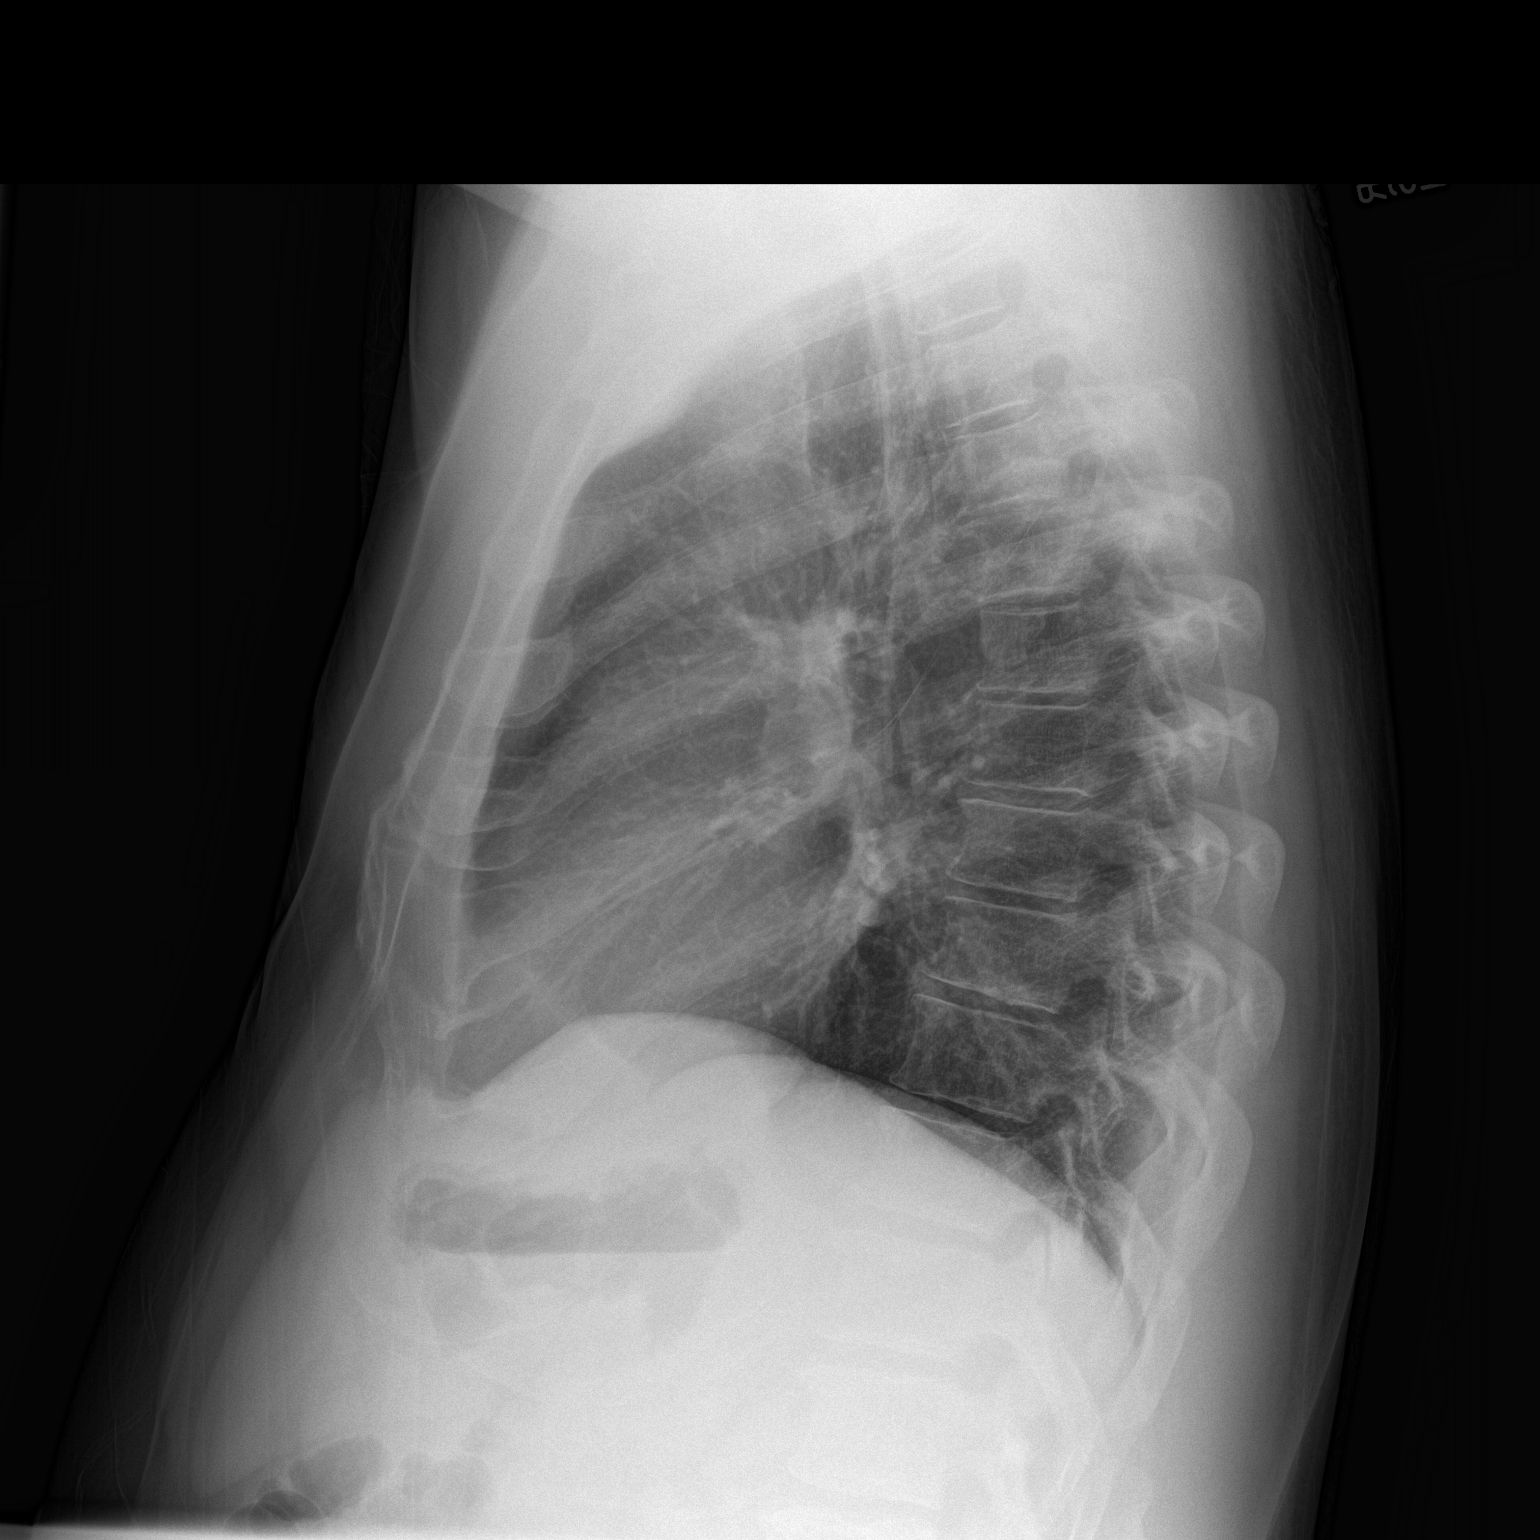

[2 of 2 positions shown; findings below may reference images not displayed]

FINDINGS: Lungs are clear. Heart size and pulmonary vascularity are normal. No
adenopathy. No pneumothorax. No bone lesions.
IMPRESSION: No edema or consolidation.

## 2016-09-06 ENCOUNTER — Emergency Department (HOSPITAL_COMMUNITY)
Admission: EM | Admit: 2016-09-06 | Discharge: 2016-09-06 | Disposition: A | Discharge status: Home / Self Care | Payer: Self-pay | Attending: Emergency Medicine | Admitting: Emergency Medicine

## 2016-09-06 ENCOUNTER — Encounter (HOSPITAL_COMMUNITY): Payer: Self-pay | Admitting: Emergency Medicine

## 2016-09-06 ENCOUNTER — Emergency Department (HOSPITAL_COMMUNITY): Payer: Self-pay

## 2016-09-06 DIAGNOSIS — F17200 Nicotine dependence, unspecified, uncomplicated: Secondary | ICD-10-CM | POA: Insufficient documentation

## 2016-09-06 DIAGNOSIS — I10 Essential (primary) hypertension: Secondary | ICD-10-CM | POA: Insufficient documentation

## 2016-09-06 DIAGNOSIS — K529 Noninfective gastroenteritis and colitis, unspecified: Secondary | ICD-10-CM | POA: Insufficient documentation

## 2016-09-06 DIAGNOSIS — Z79899 Other long term (current) drug therapy: Secondary | ICD-10-CM | POA: Insufficient documentation

## 2016-09-06 LAB — CBC
HEMATOCRIT: 41.8 % (ref 39.0–52.0)
HEMOGLOBIN: 15.1 g/dL (ref 13.0–17.0)
MCH: 30.4 pg (ref 26.0–34.0)
MCHC: 36.1 g/dL — AB (ref 30.0–36.0)
MCV: 84.3 fL (ref 78.0–100.0)
Platelets: 190 10*3/uL (ref 150–400)
RBC: 4.96 MIL/uL (ref 4.22–5.81)
RDW: 13.5 % (ref 11.5–15.5)
WBC: 6.2 10*3/uL (ref 4.0–10.5)

## 2016-09-06 LAB — COMPREHENSIVE METABOLIC PANEL
ALBUMIN: 4.3 g/dL (ref 3.5–5.0)
ALT: 46 U/L (ref 17–63)
ANION GAP: 8 (ref 5–15)
AST: 33 U/L (ref 15–41)
Alkaline Phosphatase: 73 U/L (ref 38–126)
BUN: 16 mg/dL (ref 6–20)
CALCIUM: 8.6 mg/dL — AB (ref 8.9–10.3)
CO2: 27 mmol/L (ref 22–32)
Chloride: 107 mmol/L (ref 101–111)
Creatinine, Ser: 0.94 mg/dL (ref 0.61–1.24)
GFR calc Af Amer: >60 mL/min (ref >60)
GFR calc non Af Amer: >60 mL/min (ref >60)
GLUCOSE: 80 mg/dL (ref 65–99)
Potassium: 3.5 mmol/L (ref 3.5–5.1)
SODIUM: 142 mmol/L (ref 135–145)
Total Bilirubin: 0.9 mg/dL (ref 0.3–1.2)
Total Protein: 7.7 g/dL (ref 6.5–8.1)

## 2016-09-06 LAB — URINALYSIS, ROUTINE W REFLEX MICROSCOPIC
BILIRUBIN URINE: NEGATIVE
Glucose, UA: NEGATIVE mg/dL
HGB URINE DIPSTICK: NEGATIVE
Ketones, ur: NEGATIVE mg/dL
Leukocytes, UA: NEGATIVE
Nitrite: NEGATIVE
PH: 6 (ref 5.0–8.0)
Protein, ur: NEGATIVE mg/dL
Specific Gravity, Urine: 1.017 (ref 1.005–1.030)

## 2016-09-06 LAB — LIPASE, BLOOD: Lipase: 26 U/L (ref 11–51)

## 2016-09-06 MED ORDER — IOPAMIDOL (ISOVUE-300) INJECTION 61%
INTRAVENOUS | Status: AC
  Filled 2016-09-06: qty 100

## 2016-09-06 MED ORDER — IOPAMIDOL (ISOVUE-300) INJECTION 61%
100.0000 mL | Freq: Once | INTRAVENOUS | Status: AC | PRN
  Administered 2016-09-06: 100 mL via INTRAVENOUS

## 2016-09-06 MED ORDER — ONDANSETRON 4 MG PO TBDP
4.0000 mg | ORAL_TABLET | Freq: Once | ORAL | Status: AC
  Administered 2016-09-06: 4 mg via ORAL
  Filled 2016-09-06: qty 1

## 2016-09-06 MED ORDER — ONDANSETRON HCL 4 MG PO TABS: 4.0000 mg | ORAL_TABLET | Freq: Four times a day (QID) | ORAL | 0 refills | Status: AC

## 2016-09-06 NOTE — ED Provider Notes (Signed)
[facility]-EMERGENCY DEPT Provider Note   CSN: [id number] Arrival date & time: 09/06/16  1322     History   Chief Complaint Chief Complaint  Patient presents with  . Abdominal Pain    HPI Larry Griffith is a 51 y.o. male who presents to the emergency department with abdominal pain and nausea. He reports a few days of diarrhea approximately 2 weeks ago,  which resolved shortly thereafter, with a gradual onset of a constant bilateral lower quadrant abdominal pain. He reports that the pain has been unchanged for the last 2 weeks until this morning, when he reports that he became much more sharp and severe. No aggravating or alleviating factors. No history of similar symptoms. No treatments prior to arrival. He also reports associated nausea for the past week. Last bowel movement was this morning, which he reports was loose, but he reports that he has had increased straining over the last couple weeks.  He denies fever, chills, dysuria, hematuria, or emesis. No sick contacts. No history of surgery on his abdomen. Past medical history includes hypertension, which he reports his controlled with losartan, amlodipine, and carvedilol.   The history is provided by the patient. No language interpreter was used.    Past Medical History:  Diagnosis Date  . Back pain   . Bipolar disorder (HCC)   . Hypertension   . PTSD (post-traumatic stress disorder)     There are no active problems to display for this patient.   Past Surgical History:  Procedure Laterality Date  . HAND SURGERY         Home Medications    Prior to Admission medications   Medication Sig Start Date End Date Taking? Authorizing Provider  amLODipine (NORVASC) 10 MG tablet Take 10 mg by mouth daily.   Yes [provider]  carvedilol (COREG) 12.5 MG tablet Take 12.5 mg by mouth 2 (two) times daily with a meal.   Yes [provider]  ibuprofen (ADVIL,MOTRIN) 800 MG tablet Take 800 mg by mouth every 8 (eight)  hours as needed for moderate pain.   Yes [provider]  losartan (COZAAR) 100 MG tablet Take 100 mg by mouth daily.   Yes [provider]  ondansetron (ZOFRAN) 4 MG tablet Take 1 tablet (4 mg total) by mouth every 6 (six) hours. 09/06/16   Lorenza Winkleman A, PA-C    Family History History reviewed. No pertinent family history.  Social History Social History  Substance Use Topics  . Smoking status: Current Every Day Smoker    Packs/day: 0.50  . Smokeless tobacco: Not on file  . Alcohol use No     Allergies   Patient has no known allergies.   Review of Systems Review of Systems  Constitutional: Negative for activity change.  Respiratory: Negative for shortness of breath.   Cardiovascular: Negative for chest pain.  Gastrointestinal: Negative for abdominal pain.  Musculoskeletal: Negative for back pain.  Skin: Negative for rash.     Physical Exam Updated Vital Signs BP (!) 185/104 (BP Location: Left Arm)   Pulse 72   Temp 98.4 F (36.9 C) (Oral)   Resp 18   SpO2 95%   Physical Exam  Constitutional: He appears well-developed.  HENT:  Head: Normocephalic.  Eyes: Conjunctivae are normal.  Neck: Neck supple.  Cardiovascular: Normal rate, regular rhythm, normal heart sounds and intact distal pulses.  Exam reveals no gallop and no friction rub.   No murmur heard. Pulmonary/Chest: Effort normal and breath sounds normal.  No respiratory distress. He has no wheezes. He has no rales.  Abdominal: Soft. He exhibits no distension. There is tenderness.  Diffuse tenderness to palpation over the bilateral lower quadrants and right upper quadrant. No left upper quadrant tenderness. No rebound or guarding. No CVA tenderness.   Musculoskeletal: Normal range of motion. He exhibits no edema or tenderness.  Neurological: He is alert.  Skin: Skin is warm and dry. Capillary refill takes less than 2 seconds. No rash noted.  Psychiatric: His behavior is normal.  Nursing note  and vitals reviewed.    ED Treatments / Results  Labs (all labs ordered are listed, but only abnormal results are displayed) Labs Reviewed  COMPREHENSIVE METABOLIC PANEL - Abnormal; Notable for the following:       Result Value   Calcium 8.6 (*)    All other components within normal limits  CBC - Abnormal; Notable for the following:    MCHC 36.1 (*)    All other components within normal limits  LIPASE, BLOOD  URINALYSIS, ROUTINE W REFLEX MICROSCOPIC    EKG  EKG Interpretation None       Radiology Ct Abdomen Pelvis W Contrast  Result Date: 09/06/2016 CLINICAL DATA:  Bilateral abdominal pain and nausea for the past week. No diarrhea or urinary change. EXAM: CT ABDOMEN AND PELVIS WITH CONTRAST TECHNIQUE: Multidetector CT imaging of the abdomen and pelvis was performed using the standard protocol following bolus administration of intravenous contrast. CONTRAST:  100mL ISOVUE-300 IOPAMIDOL (ISOVUE-300) INJECTION 61% COMPARISON:  None. FINDINGS: Lower chest: No acute abnormality. Hepatobiliary: Hepatic steatosis. No biliary dilatation. No hepatic mass. The gallbladder is unremarkable. Pancreas: Normal Spleen: Normal Adrenals/Urinary Tract: 3 mm interpolar hypodensity statistically consistent with a cyst involving the right kidney anteriorly. No nephrolithiasis nor obstructive uropathy. Normal bilateral adrenal glands and bladder. Stomach/Bowel: Contracted stomach. Normal small bowel rotation. Slight fluid-filled distention of proximal jejunal loops may represent a mild enteritis. No bowel obstruction is seen. Normal-appearing appendix. Large bowel is unremarkable. Vascular/Lymphatic: Aortoiliac atherosclerosis without aneurysm. No lymphadenopathy. Reproductive: Unremarkable Other: Fat containing bilateral inguinal canals. No ascites or free air. Musculoskeletal: Mild degenerative change along the dorsal spine. No acute nor suspicious osseous abnormality. IMPRESSION: 1. Minimal fluid-filled  distention proximal jejunum. Findings may represent a mild enteritis versus normal peristaltic change. 2. Hepatic steatosis. 3. Fat containing inguinal canals bilaterally. Electronically Signed   By: Tollie Ethavid  Kwon M.D.   On: 09/06/2016 16:06    Procedures Procedures (including critical care time)  Medications Ordered in ED Medications  ondansetron (ZOFRAN-ODT) disintegrating tablet 4 mg (4 mg Oral Given 09/06/16 1508)  iopamidol (ISOVUE-300) 61 % injection 100 mL (100 mLs Intravenous Contrast Given 09/06/16 1547)     Initial Impression / Assessment and Plan / ED Course  I have reviewed the triage vital signs and the nursing notes.  Pertinent labs & imaging results that were available during my care of the patient were reviewed by me and considered in my medical decision making (see chart for details).       The patient has moderate, diffuse tenderness to palpation over the bilateral lower quadrants and right upper quadrant and nausea. No emesis. Enteritis noted on abdominopelvic CT.  No history of surgical infection. Low suspicion for small bowel obstruction. Electrolytes are unremarkable. No leukocytosis or anemia noted on CT. no evidence of infection noted on UA. Nausea improving with Zofran in the ED. Suspect symptoms may be secondary to alcohol use. Hepatic steatosis noted on CT. Encourage the patient to abstain  from alcohol. Will discharge the patient to home with Zofran and PCP follow-up if symptoms do not improve and to have his blood pressure rechecked. Strict return precautions given. No acute distress. The patient is stable for discharge at this time.  Final Clinical Impressions(s) / ED Diagnoses   Final diagnoses:  Enteritis    New Prescriptions Discharge Medication List as of 09/06/2016  4:31 PM    START taking these medications   Details  ondansetron (ZOFRAN) 4 MG tablet Take 1 tablet (4 mg total) by mouth every 6 (six) hours., Starting Wed 09/06/2016, Print         Kebra Lowrimore,  Itzy Adler A, PA-C 09/07/16 1410    Gerhard Munch, MD 09/08/16 [phone]

## 2016-09-06 NOTE — ED Notes (Signed)
EDPA Provider at bedside. 

## 2016-09-06 NOTE — ED Notes (Signed)
Pt ambulatory and independent at discharge.  Verbalized understanding of discharge instructions 

## 2016-09-06 NOTE — Discharge Instructions (Signed)
You may take his Zofran once every 6 hours to help with nausea. Most cases of enteritis are self-limited and improve with time. You [facility] with [facility] and [facility] if symptoms persist. If he develop new or worsening symptoms including fever, chills, or vomiting despite Zofran, or severe abdominal pain, please return to the emergency department for reevaluation. Please schedule an appointment to have her blood pressure rechecked within one week.

## 2016-09-06 NOTE — ED Triage Notes (Signed)
Pt reports bilateral abd pain and nausea for the past week. No diarrhea or urinary changes. Has felt nauseated.

## 2017-02-07 ENCOUNTER — Emergency Department (HOSPITAL_COMMUNITY)
Admission: EM | Admit: 2017-02-07 | Discharge: 2017-02-07 | Disposition: A | Discharge status: Home / Self Care | Payer: Self-pay | Attending: Emergency Medicine | Admitting: Emergency Medicine

## 2017-02-07 ENCOUNTER — Emergency Department (HOSPITAL_COMMUNITY): Payer: Self-pay

## 2017-02-07 ENCOUNTER — Other Ambulatory Visit: Payer: Self-pay

## 2017-02-07 ENCOUNTER — Encounter (HOSPITAL_COMMUNITY): Payer: Self-pay | Admitting: Emergency Medicine

## 2017-02-07 DIAGNOSIS — Z79899 Other long term (current) drug therapy: Secondary | ICD-10-CM | POA: Insufficient documentation

## 2017-02-07 DIAGNOSIS — M79672 Pain in left foot: Secondary | ICD-10-CM

## 2017-02-07 DIAGNOSIS — F17200 Nicotine dependence, unspecified, uncomplicated: Secondary | ICD-10-CM | POA: Insufficient documentation

## 2017-02-07 DIAGNOSIS — I10 Essential (primary) hypertension: Secondary | ICD-10-CM | POA: Insufficient documentation

## 2017-02-07 MED ORDER — TRAMADOL HCL 50 MG PO TABS
50.0000 mg | ORAL_TABLET | Freq: Once | ORAL | Status: AC
Start: 2017-02-07 — End: 2017-02-07
  Administered 2017-02-07: 50 mg via ORAL
  Filled 2017-02-07: qty 1

## 2017-02-07 MED ORDER — PREDNISONE 50 MG PO TABS: ORAL_TABLET | ORAL | 0 refills | Status: AC

## 2017-02-07 MED ORDER — HYDROCODONE-ACETAMINOPHEN 5-325 MG PO TABS: 1.0000 | ORAL_TABLET | ORAL | 0 refills | Status: AC | PRN

## 2017-02-07 MED ORDER — KETOROLAC TROMETHAMINE 60 MG/2ML IM SOLN
30.0000 mg | Freq: Once | INTRAMUSCULAR | Status: AC
  Administered 2017-02-07: 30 mg via INTRAMUSCULAR
  Filled 2017-02-07: qty 2

## 2017-02-07 NOTE — Discharge Instructions (Signed)
Follow up with your primary care provider or with Dr. Logan BoresEvans for follow up of your foot pain.

## 2017-02-07 NOTE — ED Triage Notes (Signed)
Pt reports 2 day hx of pain on top of foot. Pt denies hx of gout, denies trauma. Ambulatory with limp

## 2017-02-07 NOTE — ED Provider Notes (Signed)
[facility]-EMERGENCY DEPT Provider Note   CSN: [id number] Arrival date & time: 02/07/17  1121     History   Chief Complaint Chief Complaint  Patient presents with  . Foot Pain    HPI Larry Griffith is a 51 y.o. male who presents to the ED with left foot pain. Patient states he woke with the pain to the top of his foot yesterday and it has continued. The pain worsens with ambulation and any pressure to the top of his foot. Patient denies hx of gout or any similar problem in the past. 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 He does not remember any injury to the area.   HPI  Past Medical History:  Diagnosis Date  . Back pain   . Bipolar disorder (HCC)   . Hypertension   . PTSD (post-traumatic stress disorder)     There are no active problems to display for this patient.   Past Surgical History:  Procedure Laterality Date  . HAND SURGERY    . MOUTH SURGERY         Home Medications    Prior to Admission medications   Medication Sig Start Date End Date Taking? Authorizing Provider  amLODipine (NORVASC) 10 MG tablet Take 10 mg by mouth daily.    [provider]  carvedilol (COREG) 12.5 MG tablet Take 12.5 mg by mouth 2 (two) times daily with a meal.    [provider]  HYDROcodone-acetaminophen (NORCO/VICODIN) 5-325 MG tablet Take 1 tablet by mouth every 4 (four) hours as needed. 02/07/17   Janne NapoleonNeese, Nuh Lipton M, NP  ibuprofen (ADVIL,MOTRIN) 800 MG tablet Take 800 mg by mouth every 8 (eight) hours as needed for moderate pain.    [provider]  losartan (COZAAR) 100 MG tablet Take 100 mg by mouth daily.    [provider]  ondansetron (ZOFRAN) 4 MG tablet Take 1 tablet (4 mg total) by mouth every 6 (six) hours. 09/06/16   McDonald, Mia A, PA-C  predniSONE (DELTASONE) 50 MG tablet Take one tablet PO daily 02/07/17   Janne NapoleonNeese, Shi Blankenship M, NP    Family History Family History  Problem Relation  Age of Onset  . Cancer Mother   . Hypertension Father   . Cancer Father     Social History Social History   Tobacco Use  . Smoking status: Current Every Day Smoker    Packs/day: 0.50  . Smokeless tobacco: Never Used  Substance Use Topics  . Alcohol use: Yes    Comment: occ  . Drug use: Yes    Types: Marijuana    Comment: yesterday     Allergies   Patient has no known allergies.   Review of Systems Review of Systems  Musculoskeletal: Positive for arthralgias.       Left foot pain  All other systems reviewed and are negative.    Physical Exam Updated Vital Signs BP (!) 156/97 (BP Location: Right Arm)   Pulse 79   Temp 97.9 F (36.6 C) (Oral)   Wt 95.3 kg (210 lb)   SpO2 99%   BMI 30.13 kg/m   Physical Exam  Constitutional: He appears well-developed and well-nourished.  Eyes: EOM are normal.  Neck: Neck supple.  Cardiovascular: Normal rate.  Pulmonary/Chest: Effort normal.  Musculoskeletal:       Left foot: There is tenderness and laceration. There is normal capillary refill and no deformity.  Pedal pulses 2+, adequate  circulation, tender with palpation to the dorsum of the left foot. No swelling, no ecchymosis noted. There is slight increased warmth to the dorsum of the left foot but no erythema or red streaking.   Neurological: He is alert.  Skin: Skin is warm and dry.  Nursing note and vitals reviewed.    ED Treatments / Results  Labs (all labs ordered are listed, but only abnormal results are displayed) Labs Reviewed - No data to display   Radiology Dg Foot Complete Left  Result Date: 02/07/2017 CLINICAL DATA:  If foot pain for the past 2 days with no known trauma. No soft tissue swelling. EXAM: LEFT FOOT - COMPLETE 3+ VIEW COMPARISON:  None in PACs FINDINGS: The bones are subjectively adequately mineralized. There is no acute fracture nor dislocation. There is no lytic or blastic bony lesion. The joint spaces are reasonably well-maintained. The  soft tissues are unremarkable. IMPRESSION: There is no acute or significant chronic bony abnormality of the left foot. Electronically Signed   By: David  SwazilandJordan M.D.   On: 02/07/2017 13:20    Procedures Procedures (including critical care time)  Medications Ordered in ED Medications  ketorolac (TORADOL) injection 30 mg (30 mg Intramuscular Given 02/07/17 1325)  traMADol (ULTRAM) tablet 50 mg (50 mg Oral Given 02/07/17 1325)     Initial Impression / Assessment and Plan / ED Course  I have reviewed the triage vital signs and the nursing notes.  51 y.o. male with left foot pain stable for d/c without fracture or dislocation noted on x-ray and no signs of infection. Will treat for pain and inflammation and patient will f/u with his PCP or with [facility]. Return precautions discussed.   Final Clinical Impressions(s) / ED Diagnoses   Final diagnoses:  Foot pain, left    ED Discharge Orders        Ordered    predniSONE (DELTASONE) 50 MG tablet     02/07/17 1407    HYDROcodone-acetaminophen (NORCO/VICODIN) 5-325 MG tablet  Every 4 hours PRN     02/07/17 1410       Damian Leavelleese, [place] M, NP 02/07/17 2305    Nira Connardama, Pedro Eduardo, MD 02/08/17 [id number]

## 2017-02-09 ENCOUNTER — Encounter: Payer: Self-pay | Admitting: Pediatric Intensive Care

## 2017-02-09 MED FILL — predniSONE 50 MG TABS: 50 | 5 days supply | Qty: 5 | Fill #0

## 2017-03-13 NOTE — Congregational Nurse Program (Signed)
Congregational Nurse Program Note  Date of Encounter: 02/09/2017  Past Medical History: Past Medical History:  Diagnosis Date  . Back pain   . Bipolar disorder (HCC)   . Hypertension   . PTSD (post-traumatic stress disorder)     Encounter Details:  BP check. [facility] and was seen there on Monday. He has not picked up his BP medication yet. Needs bus passes for [facility]. Will follow up for BP check as needed.

## 2018-01-21 ENCOUNTER — Emergency Department (HOSPITAL_COMMUNITY)
Admission: EM | Admit: 2018-01-21 | Discharge: 2018-01-21 | Disposition: A | Discharge status: Home / Self Care | Payer: Self-pay | Attending: Emergency Medicine | Admitting: Emergency Medicine

## 2018-01-21 ENCOUNTER — Encounter (HOSPITAL_COMMUNITY): Payer: Self-pay | Admitting: Emergency Medicine

## 2018-01-21 DIAGNOSIS — F17200 Nicotine dependence, unspecified, uncomplicated: Secondary | ICD-10-CM | POA: Insufficient documentation

## 2018-01-21 DIAGNOSIS — Z79899 Other long term (current) drug therapy: Secondary | ICD-10-CM | POA: Insufficient documentation

## 2018-01-21 DIAGNOSIS — I10 Essential (primary) hypertension: Secondary | ICD-10-CM | POA: Insufficient documentation

## 2018-01-21 DIAGNOSIS — H5712 Ocular pain, left eye: Secondary | ICD-10-CM | POA: Insufficient documentation

## 2018-01-21 MED ORDER — ERYTHROMYCIN 5 MG/GM OP OINT
1.0000 "application " | TOPICAL_OINTMENT | Freq: Once | OPHTHALMIC | Status: AC
  Administered 2018-01-21: 1 via OPHTHALMIC
  Filled 2018-01-21: qty 3.5

## 2018-01-21 MED ORDER — ERYTHROMYCIN 5 MG/GM OP OINT: 1.0000 "application " | TOPICAL_OINTMENT | Freq: Once | OPHTHALMIC | Status: DC

## 2018-01-21 MED ORDER — TETRACAINE HCL 0.5 % OP SOLN: 2.0000 [drp] | Freq: Once | OPHTHALMIC | Status: DC

## 2018-01-21 MED ORDER — AMOXICILLIN-POT CLAVULANATE 875-125 MG PO TABS: 1.0000 | ORAL_TABLET | Freq: Two times a day (BID) | ORAL | 0 refills | Status: AC

## 2018-01-21 MED ORDER — ERYTHROMYCIN 5 MG/GM OP OINT: TOPICAL_OINTMENT | OPHTHALMIC | 0 refills | Status: AC

## 2018-01-21 MED ORDER — AMOXICILLIN-POT CLAVULANATE 875-125 MG PO TABS
1.0000 | ORAL_TABLET | Freq: Once | ORAL | Status: AC
  Administered 2018-01-21: 1 via ORAL
  Filled 2018-01-21: qty 1

## 2018-01-21 MED ORDER — FLUORESCEIN SODIUM 1 MG OP STRP: 1.0000 | ORAL_STRIP | Freq: Once | OPHTHALMIC | Status: DC

## 2018-01-21 NOTE — ED Triage Notes (Signed)
Pt presents with pain, itching, swelling, redness, drainage to L eye. Present since Saturday.

## 2018-01-21 NOTE — ED Provider Notes (Addendum)
[facility]   CSN: [id number] Arrival date & time: 01/21/18  0326     History   Chief Complaint Chief Complaint  Patient presents with  . Eye Pain    HPI Larry Griffith is a 52 y.o. male.  The history is provided by the patient and medical records.  Eye Pain      52 y.o. M with history of bipolar disorder, HTN, PTSD, presenting to the ED for eye pain.  Patient reports he starting feeling some irritation to his left eyelid on Saturday but woke up this morning with worsening pain and feels like now his entire eyelid is irritated.  States initially he thought it was due to allergies so tried some OTC allergy medicine without change.  He denies visual disturbance or pain with eye movement.  He does not wear glasses or contact lenses.  Does report eye watering throughout the day yesterday intermittently.  Past Medical History:  Diagnosis Date  . Back pain   . Bipolar disorder (HCC)   . Hypertension   . PTSD (post-traumatic stress disorder)     There are no active problems to display for this patient.   Past Surgical History:  Procedure Laterality Date  . HAND SURGERY    . MOUTH SURGERY          Home Medications    Prior to Admission medications   Medication Sig Start Date End Date Taking? Authorizing Provider  amLODipine (NORVASC) 10 MG tablet Take 10 mg by mouth daily.    [provider]  carvedilol (COREG) 12.5 MG tablet Take 12.5 mg by mouth 2 (two) times daily with a meal.    [provider]  HYDROcodone-acetaminophen (NORCO/VICODIN) 5-325 MG tablet Take 1 tablet by mouth every 4 (four) hours as needed. 02/07/17   Janne NapoleonNeese, Hope M, NP  ibuprofen (ADVIL,MOTRIN) 800 MG tablet Take 800 mg by mouth every 8 (eight) hours as needed for moderate pain.    [provider]  losartan (COZAAR) 100 MG tablet Take 100 mg by mouth daily.    [provider]  ondansetron (ZOFRAN) 4 MG tablet  Take 1 tablet (4 mg total) by mouth every 6 (six) hours. 09/06/16   McDonald, Mia A, PA-C  predniSONE (DELTASONE) 50 MG tablet Take one tablet PO daily 02/07/17   Janne NapoleonNeese, Hope M, NP    Family History Family History  Problem Relation Age of Onset  . Cancer Mother   . Hypertension Father   . Cancer Father     Social History Social History   Tobacco Use  . Smoking status: Current Every Day Smoker    Packs/day: 0.25  . Smokeless tobacco: Never Used  Substance Use Topics  . Alcohol use: Not Currently    Comment: Socially   . Drug use: Yes    Types: Marijuana     Allergies   Patient has no known allergies.   Review of Systems Review of Systems  Eyes: Positive for pain.  All other systems reviewed and are negative.    Physical Exam Updated Vital Signs BP (!) 196/119 (BP Location: Right Arm)   Pulse 81   Temp (!) 97.4 F (36.3 C) (Oral)   Resp 20   Ht 5\' 10"  (1.778 m)   Wt 99.8 kg   SpO2 94%   BMI 31.57 kg/m   Physical Exam  Constitutional: He is oriented to person, place, and time. He appears well-developed and well-nourished.  HENT:  Head:  Normocephalic and atraumatic.  Mouth/Throat: Oropharynx is clear and moist.   left eyelid does some have erythema along the upper lash line with what appears to be a small stye centrally; he has some very faint erythema extending upward into the eyelid towards the eyebrow but no significant edema or warmth to touch; no abscess formation; no drainage; EOMs fully intact without any noted pain  Eyes: Pupils are equal, round, and reactive to light. Conjunctivae and EOM are normal.  Neck: Normal range of motion.  Cardiovascular: Normal rate, regular rhythm and normal heart sounds.  Pulmonary/Chest: Effort normal and breath sounds normal.  Abdominal: Soft. Bowel sounds are normal.  Musculoskeletal: Normal range of motion.  Neurological: He is alert and oriented to person, place, and time.  Skin: Skin is warm and dry.  Psychiatric:  He has a normal mood and affect.  Nursing note and vitals reviewed.    ED Treatments / Results  Labs (all labs ordered are listed, but only abnormal results are displayed) Labs Reviewed - No data to display  EKG None  Radiology No results found.  Procedures Procedures (including critical care time)  Medications Ordered in ED Medications  amoxicillin-clavulanate (AUGMENTIN) 875-125 MG per tablet 1 tablet (1 tablet Oral Given 01/21/18 0434)  erythromycin ophthalmic ointment 1 application (1 application Left Eye Given 01/21/18 0434)     Initial Impression / Assessment and Plan / ED Course  I have reviewed the triage vital signs and the nursing notes.  Pertinent labs & imaging results that were available during my care of the patient were reviewed by me and considered in my medical decision making (see chart for details).  52 year old male here with left eye pain.  States this began yesterday.  Initially started as a "irritation" on his left upper eyelid and since has been worsening.  On exam he does have some erythema along the lash line of the left upper eyelid with what appears to be a small stye centrally.  Does have some mild erythema extending up towards the eyebrows but this is very mild without any noted warmth to touch or significant edema.  EOMs are fully intact and no pain with this.  He does not have any conjunctival injection or hemorrhage.  No evidence of foreign body.  Suspect this is likely all related to stye, however given progressive erythema we will start on erythromycin ointment as well as Augmentin for coverage of possible early/developing preseptal cellulitis.  Also recommended warm compresses.  He does not have any signs or symptoms suggestive of orbital cellulitis at this time.  I recommended that he follow-up closely with ophthalmology, specifically if no improvement in the next 48 hours or if symptoms worsening.  He will return here for any new concerns.  Final  Clinical Impressions(s) / ED Diagnoses   Final diagnoses:  Left eye pain    ED Discharge Orders         Ordered    erythromycin ophthalmic ointment     01/21/18 0419    amoxicillin-clavulanate (AUGMENTIN) 875-125 MG tablet  Every 12 hours     01/21/18 0419           Garlon Hatchet, PA-C 01/21/18 0507    Garlon Hatchet, PA-C 01/21/18 0507    Glynn Octave, MD 01/21/18 [phone]

## 2018-01-21 NOTE — ED Notes (Signed)
E-signature not available, pt verbalized understanding of DC instructions and prescriptions 

## 2018-01-21 NOTE — Discharge Instructions (Signed)
Take the prescribed medication as directed.  Can do warm compresses with warm cloth on the eye a few times a day. Follow-up with eye doctor-- call specifically if worsening or no improvement in the next 48 hours. Return to the ED for new concerns.

## 2019-01-13 IMAGING — CT CT ABD-PELV W/ CM
2 of 5 series · 16 of 46 positions shown, 18 images · IV contrast (ISOVUE)
Comparison: None.

CLINICAL DATA: Bilateral abdominal pain and nausea for the past
week. No diarrhea or urinary change.

EXAM:
CT ABDOMEN AND PELVIS WITH CONTRAST
TECHNIQUE: Multidetector CT imaging of the abdomen and pelvis was performed
using the standard protocol following bolus administration of
intravenous contrast.
CONTRAST:  100mL [id number] IOPAMIDOL ([id number]) INJECTION 61%

[Series 2: abd/pel with · axial · 0.80mm/px · z∈[-493,-108]mm · 13 of 91 slices shown, 15 images]
[im 7/91  soft-tissue]
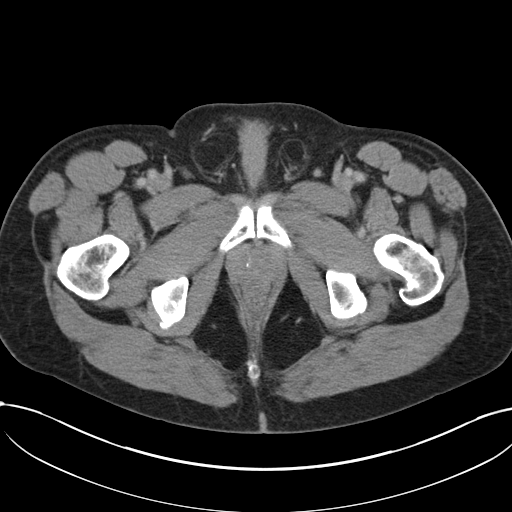
[im 7/91  bone]
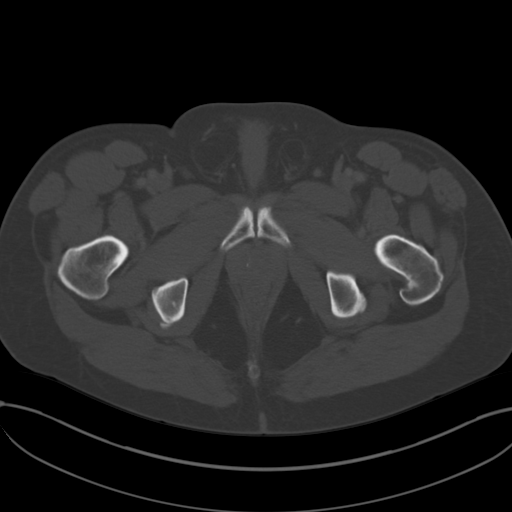
[im 13/91  soft-tissue]
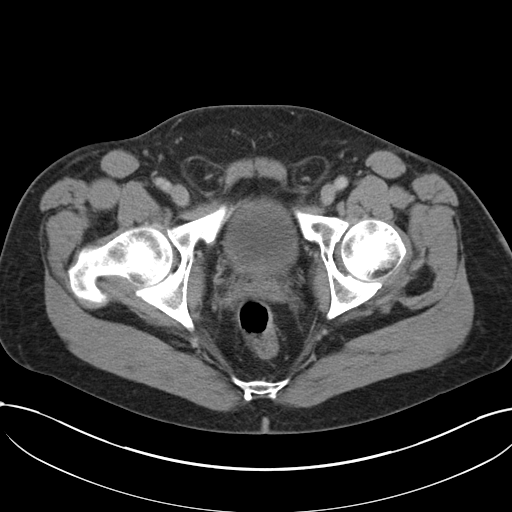
[im 20/91  soft-tissue]
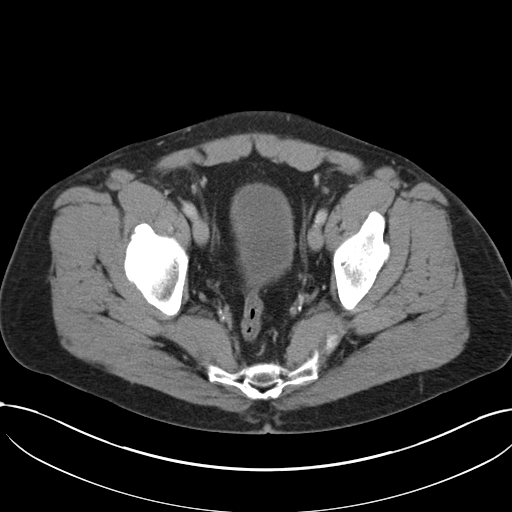
[im 26/91  soft-tissue]
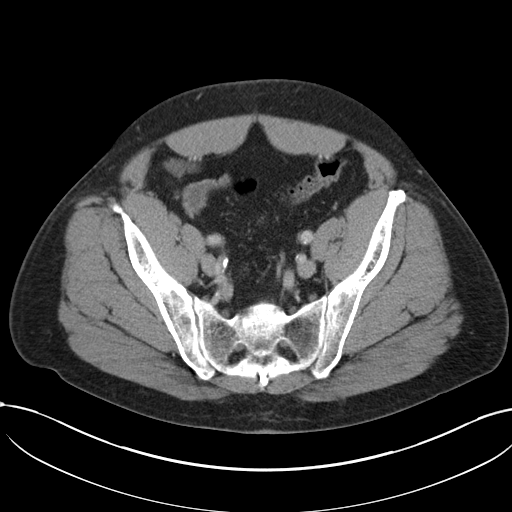
[im 33/91  soft-tissue]
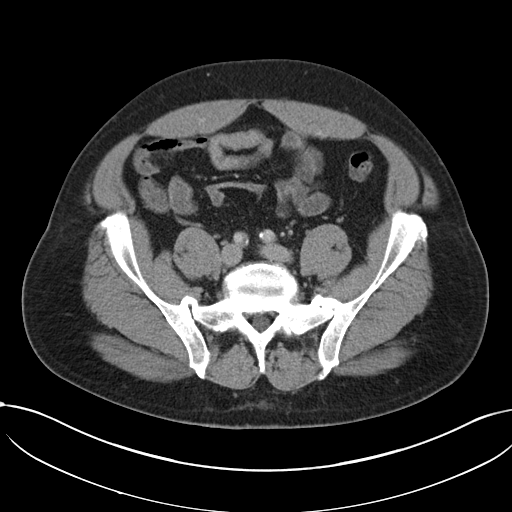
[im 39/91  soft-tissue]
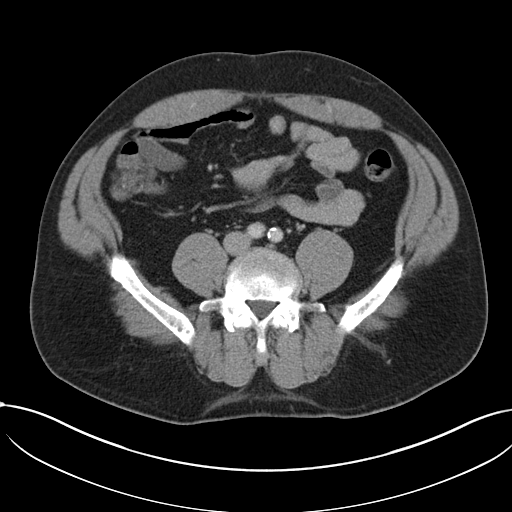
[im 46/91  soft-tissue]
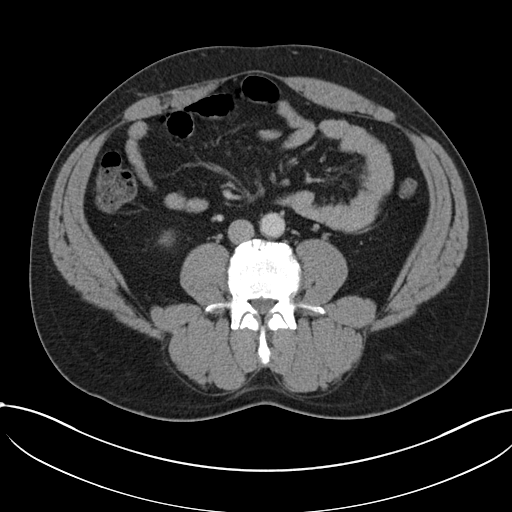
[im 52/91  soft-tissue]
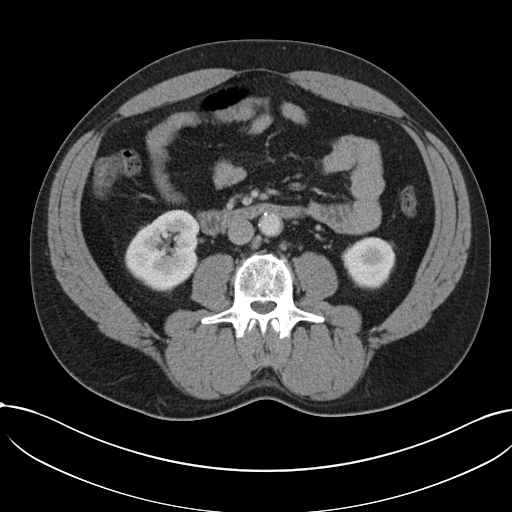
[im 58/91  soft-tissue]
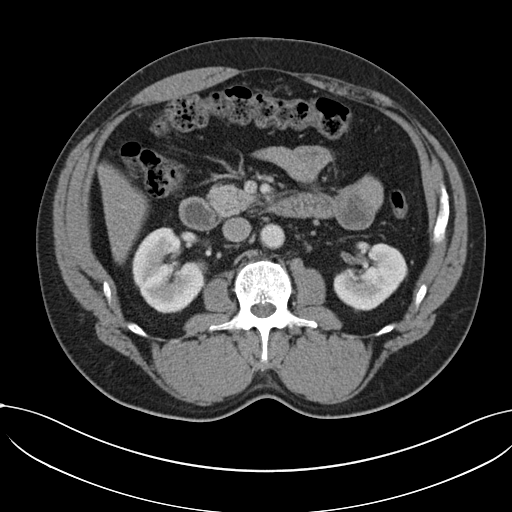
[im 58/91  bone]
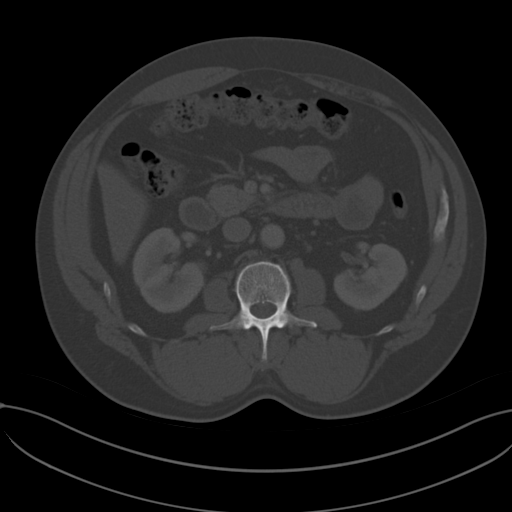
[im 65/91  soft-tissue]
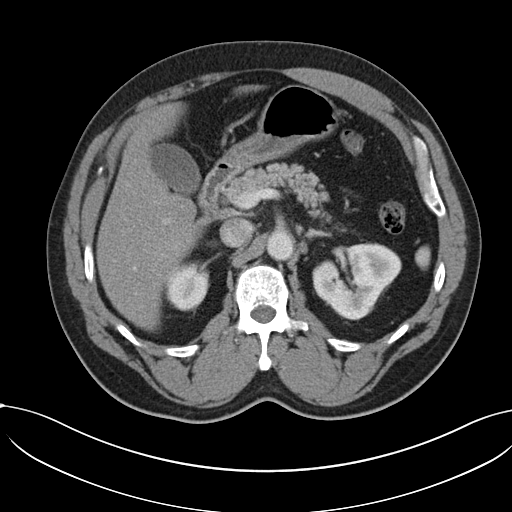
[im 71/91  soft-tissue]
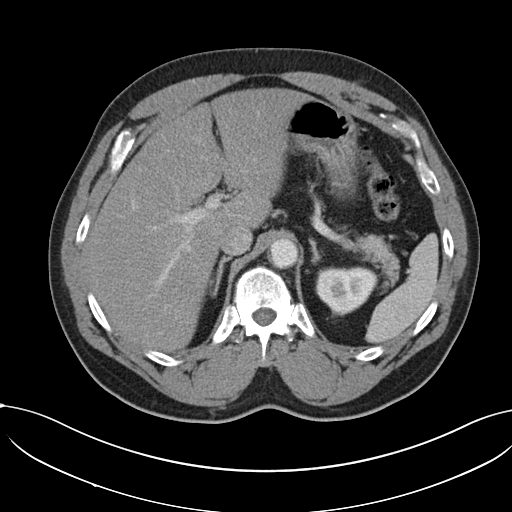
[im 78/91  soft-tissue]
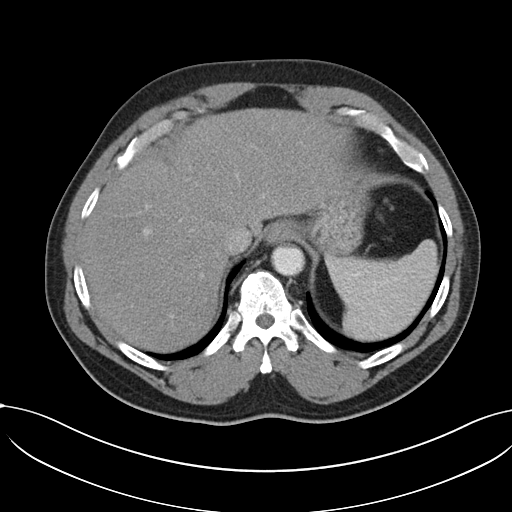
[im 84/91  soft-tissue]
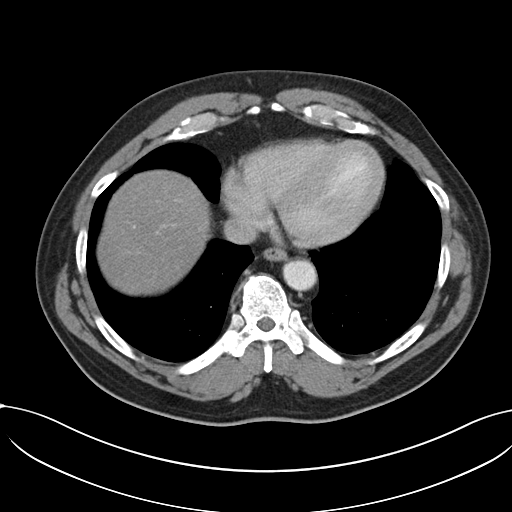

[Series 5: coronal a/|p · coronal · 0.76mm/px · 3 of 147 slices shown]
[im 49/147  soft-tissue]
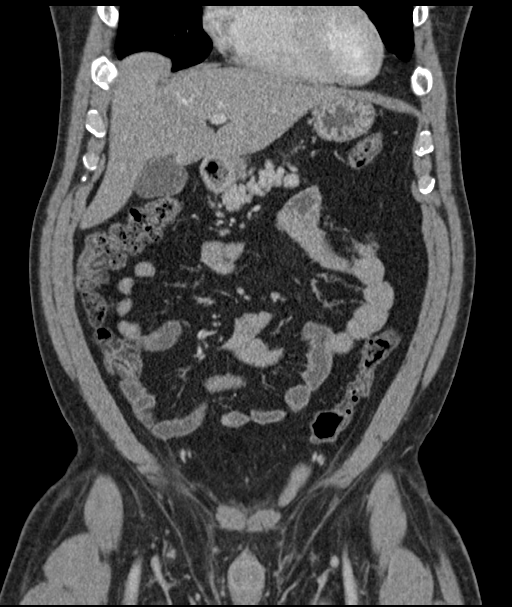
[im 65/147  soft-tissue]
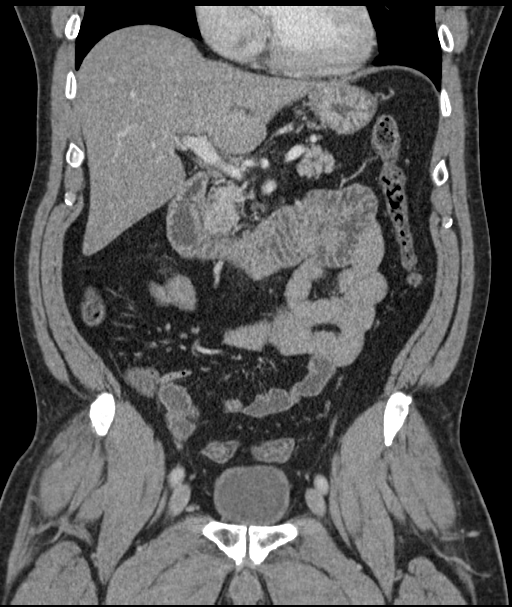
[im 82/147  soft-tissue]
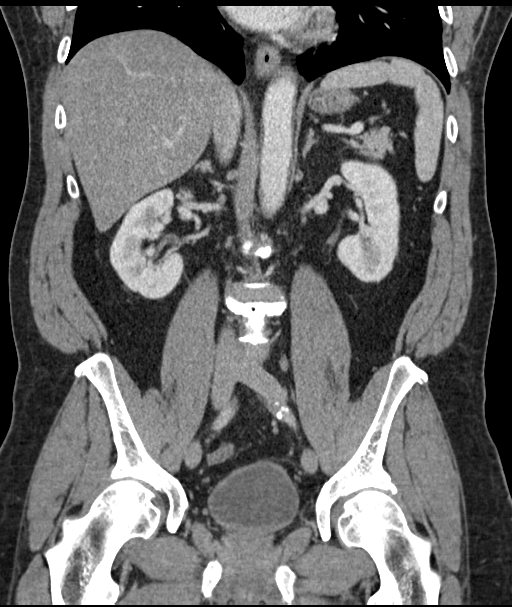

[16 of 46 positions shown; findings below may reference images not displayed]

FINDINGS: Lower chest: No acute abnormality.

Hepatobiliary: Hepatic steatosis. No biliary dilatation. No hepatic
mass. The gallbladder is unremarkable.

Pancreas: Normal

Spleen: Normal

Adrenals/Urinary Tract: 3 mm interpolar hypodensity statistically
consistent with a cyst involving the right kidney anteriorly. No
nephrolithiasis nor obstructive uropathy. Normal bilateral adrenal
glands and bladder.

Stomach/Bowel: Contracted stomach. Normal small bowel rotation.
Slight fluid-filled distention of proximal jejunal loops may
represent a mild enteritis. No bowel obstruction is seen.
Normal-appearing appendix. Large bowel is unremarkable.

Vascular/Lymphatic: Aortoiliac atherosclerosis without aneurysm. No
lymphadenopathy.

Reproductive: Unremarkable

Other: Fat containing bilateral inguinal canals. No ascites or free
air.

Musculoskeletal: Mild degenerative change along the dorsal spine. No
acute nor suspicious osseous abnormality.
IMPRESSION: 1. Minimal fluid-filled distention proximal jejunum. Findings may
represent a mild enteritis versus normal peristaltic change.
2. Hepatic steatosis.
3. Fat containing inguinal canals bilaterally.
